# Patient Record
Sex: Female | Born: 1975 | Race: Black or African American | Hispanic: No | Marital: Single | Smoking: Never smoker
Health system: Southern US, Community
[De-identification: ages and names within clinical notes are randomized; demographics above are authoritative.]

## PROBLEM LIST (undated history)

## (undated) DIAGNOSIS — E119 Type 2 diabetes mellitus without complications: Secondary | ICD-10-CM

## (undated) DIAGNOSIS — M1990 Unspecified osteoarthritis, unspecified site: Secondary | ICD-10-CM

## (undated) HISTORY — DX: Type 2 diabetes mellitus without complications: E11.9

## (undated) HISTORY — DX: Unspecified osteoarthritis, unspecified site: M19.90

## (undated) HISTORY — PX: BREAST EXCISIONAL BIOPSY: SUR124

## (undated) HISTORY — PX: BREAST SURGERY: SHX581

## (undated) HISTORY — PX: HERNIA REPAIR: SHX51

## (undated) HISTORY — PX: BREAST BIOPSY: SHX20

---

## 2005-06-14 DIAGNOSIS — K5792 Diverticulitis of intestine, part unspecified, without perforation or abscess without bleeding: Secondary | ICD-10-CM

## 2005-06-14 HISTORY — DX: Diverticulitis of intestine, part unspecified, without perforation or abscess without bleeding: K57.92

## 2017-05-31 ENCOUNTER — Other Ambulatory Visit: Payer: Self-pay | Admitting: Family

## 2017-05-31 DIAGNOSIS — N6320 Unspecified lump in the left breast, unspecified quadrant: Secondary | ICD-10-CM

## 2017-05-31 DIAGNOSIS — N644 Mastodynia: Secondary | ICD-10-CM

## 2017-06-15 ENCOUNTER — Other Ambulatory Visit: Payer: Self-pay

## 2017-06-20 ENCOUNTER — Other Ambulatory Visit: Payer: Self-pay

## 2017-06-30 ENCOUNTER — Other Ambulatory Visit: Payer: Self-pay | Admitting: Family

## 2017-06-30 ENCOUNTER — Other Ambulatory Visit: Payer: Self-pay

## 2017-06-30 DIAGNOSIS — N644 Mastodynia: Secondary | ICD-10-CM

## 2017-07-04 ENCOUNTER — Other Ambulatory Visit: Payer: Self-pay

## 2017-08-29 ENCOUNTER — Other Ambulatory Visit (HOSPITAL_COMMUNITY): Payer: Self-pay | Admitting: *Deleted

## 2017-08-29 DIAGNOSIS — N644 Mastodynia: Secondary | ICD-10-CM

## 2017-09-15 ENCOUNTER — Ambulatory Visit
Admission: RE | Admit: 2017-09-15 | Discharge: 2017-09-15 | Disposition: A | Discharge status: Home / Self Care | Payer: No Typology Code available for payment source | Source: Ambulatory Visit | Attending: Obstetrics and Gynecology | Admitting: Obstetrics and Gynecology

## 2017-09-15 ENCOUNTER — Ambulatory Visit (HOSPITAL_COMMUNITY)
Admission: RE | Admit: 2017-09-15 | Discharge: 2017-09-15 | Disposition: A | Discharge status: Home / Self Care | Payer: Self-pay | Source: Ambulatory Visit | Attending: Obstetrics and Gynecology | Admitting: Obstetrics and Gynecology

## 2017-09-15 ENCOUNTER — Encounter (HOSPITAL_COMMUNITY): Payer: Self-pay

## 2017-09-15 VITALS — BP 114/72 | Ht 65.5 in | Wt 293.8 lb

## 2017-09-15 DIAGNOSIS — N644 Mastodynia: Secondary | ICD-10-CM

## 2017-09-15 DIAGNOSIS — Z1239 Encounter for other screening for malignant neoplasm of breast: Secondary | ICD-10-CM

## 2017-09-15 NOTE — Progress Notes (Signed)
Complaints of bilateral breast lumps x one year left breast and x 2 years right breast. Patient complained of bilateral breast pain that comes and goes. Patient rates the pain at a 10 out of 10.  Pap Smear: Pap smear not completed today. Last Pap smear was at [facility] in December 2017 and normal per patient. Per patient has no history of an abnormal Pap smear. No Pap smear results are in Epic.  Physical exam: Breasts Right breast larger than left breast that per patient is due to a left breast lumpectomy in 2001 for a benign breast lesion. No skin abnormalities bilateral breasts. No nipple retraction bilateral breasts. No nipple discharge bilateral breasts. No lymphadenopathy. No lumps palpated bilateral breasts. Unable to palpated any lumps in patients areas of concern. Complaints of right outer and left inner breast tenderness on exam. [facility] for a diagnostic mamm[facility] and possible bilateral breast ultrasounds. Appointment scheduled for Thursday, September 15, 2017 at 1320.        Pelvic/Bimanual No Pap smear completed today since last Pap smear was in December 2017 per patient. Pap smear not indicated per BCCCP guidelines.   Smoking History: Patient has never smoked.  Patient Navigation: Patient education provided. Access to services provided for patient through BCCCP program.   Breast and Cervical Cancer Risk Assessment: Patient has no family history of breast cancer, known genetic mutations, or radiation treatment to the chest before age 41. Patient has no history of cervical dysplasia, immunocompromised, or DES exposure in-utero.

## 2017-09-15 NOTE — Patient Instructions (Signed)
Explained breast self awareness with Theodosia QuayShuntaneka Driggs. Patient did not need a Pap smear today due to last Pap smear was in December 2017 per patient. Let her know BCCCP will cover Pap smears every 3 years unless has a history of abnormal Pap smears. [facility] for a diagnostic mammogram and possible bilateral breast ultrasounds. Appointment scheduled for Thursday, September 15, 2017 at 1320. Galvin ProfferShuntaneka Halliday verbalized understanding.  Peni Rupard, Kathaleen Maserhristine Poll, RN 2:28 PM

## 2017-09-16 ENCOUNTER — Encounter (HOSPITAL_COMMUNITY): Payer: Self-pay | Admitting: *Deleted

## 2018-02-20 ENCOUNTER — Other Ambulatory Visit (HOSPITAL_COMMUNITY): Payer: Self-pay | Admitting: *Deleted

## 2018-02-20 DIAGNOSIS — Z0000 Encounter for general adult medical examination without abnormal findings: Secondary | ICD-10-CM

## 2018-02-22 ENCOUNTER — Inpatient Hospital Stay: Payer: Self-pay | Attending: Obstetrics and Gynecology | Admitting: *Deleted

## 2018-02-22 ENCOUNTER — Encounter (HOSPITAL_COMMUNITY): Payer: Self-pay

## 2018-02-22 ENCOUNTER — Inpatient Hospital Stay: Payer: Self-pay

## 2018-02-22 VITALS — BP 122/78 | Ht 65.0 in | Wt 298.0 lb

## 2018-02-22 DIAGNOSIS — Z0000 Encounter for general adult medical examination without abnormal findings: Secondary | ICD-10-CM

## 2018-02-22 LAB — LIPID PANEL
Cholesterol: 143 mg/dL (ref 0–200)
HDL: 34 mg/dL — ABNORMAL LOW (ref >40)
LDL Cholesterol: 82 mg/dL (ref 0–99)
Total CHOL/HDL Ratio: 4.2 RATIO
Triglycerides: 134 mg/dL (ref <150)
VLDL: 27 mg/dL (ref 0–40)

## 2018-02-22 LAB — HEMOGLOBIN A1C
HEMOGLOBIN A1C: 7.3 % — AB (ref 4.8–5.6)
MEAN PLASMA GLUCOSE: 162.81 mg/dL

## 2018-02-22 NOTE — Progress Notes (Signed)
[facility] initial screening  Clinical Measurement:  Height: 65in Weight: 298lb Blood Pressure:  120/80  Blood Pressure #2: 122/78  Fasting Labs Drawn Today, will review with patient when they result.  Medical History:  Patient states that she has not been diagnosed with high cholesterol, high blood pressure, diabetes or heart disease.  Medications:  Patients states she is not taking any medications for high cholesterol, high blood pressure or diabetes. Patient states she is taking metformin for prediabetes. She is not taking aspirin daily to prevent heart attack or stroke.    Blood pressure, self measurement:  Patients states she does not measure blood pressure at home.    Nutrition:  Patient states she eats 0 cups of fruit and 2 cups of vegetables in an average day.  Patient states she does  eat fish regularly, she eats more than half a serving of whole grains daily. She drinks less than 36 ounces of beverages with added sugar weekly.  She is currently watching her sodium intake.  She has not had any drinks containing alcohol in the last seven days.    Physical activity:  Patient states that she gets 0 minutes of moderate exercise in a week.  She gets 0 minutes of vigorous exercise per week.   Smoking status:  Patient states she has never smoked and is not around any smokers.     Quality of life:  Patient states that she has had 0 bad physical days out of the last 30 days. In the last 2 weeks, she has had 0 days that she has felt down or depressed. She has had 0 days in the last 2 weeks that she has had little interest or pleasure in doing things.  Risk reduction and counseling:  Patient states she wants to lose weight and increase fruit and vegetable intake.  I encouraged her to continue with current exercise regimen and increase vegetable and fruit intake.  Navigation:  I will notify patient of lab results.  Patient is aware of 2 more health coaching sessions and a follow up.

## 2018-02-22 NOTE — Addendum Note (Signed)
Addended by: Deforest Hoyles D on: 02/22/2018 12:27 PM   Modules accepted: Orders

## 2018-02-22 NOTE — Addendum Note (Signed)
Encounter addended by: Lynnell Dike, LPN on: 6/50/3546 3:05 PM  Actions taken: Vitals modified, Order Reconciliation Section accessed

## 2018-02-27 ENCOUNTER — Telehealth (HOSPITAL_COMMUNITY): Payer: Self-pay | Admitting: *Deleted

## 2018-02-27 NOTE — Telephone Encounter (Signed)
Health Coaching 2   Labs- cholesterol 143, triglycerides 134, LDL cholesterol 82, HDL cholesterol 34, hemoglobin A1C 7.3, mean plasma glucose 162.81    Patient is aware and understands her lab results.  Goals- patient states she wants to lose weight.  I encouraged patient to walk 30 minutes daily times five days and to drink 8 to 10 glasses of water daily.  I also encouraged patient to increase her fruit and vegetable intake to include 5 fruits and vegetables daily.   Navigation: Patient is aware of 1 more health coaching sessions and a follow up. Patient is currently taking Metformin 500 mg daily by mouth although she states she is not diabetic, patient referred to [facility].  Time- 10 minutes

## 2018-03-27 ENCOUNTER — Ambulatory Visit (INDEPENDENT_AMBULATORY_CARE_PROVIDER_SITE_OTHER): Payer: Self-pay | Admitting: Internal Medicine

## 2018-03-27 ENCOUNTER — Other Ambulatory Visit: Payer: Self-pay

## 2018-03-27 ENCOUNTER — Encounter: Payer: Self-pay | Admitting: Internal Medicine

## 2018-03-27 VITALS — BP 130/76 | HR 76 | Temp 99.2°F | Ht 65.0 in | Wt 300.9 lb

## 2018-03-27 DIAGNOSIS — E119 Type 2 diabetes mellitus without complications: Secondary | ICD-10-CM

## 2018-03-27 HISTORY — DX: Type 2 diabetes mellitus without complications: E11.9

## 2018-03-27 MED ORDER — METFORMIN HCL 500 MG PO TABS: 500.0000 mg | ORAL_TABLET | Freq: Two times a day (BID) | ORAL | 3 refills | Status: DC

## 2018-03-27 NOTE — Assessment & Plan Note (Addendum)
Spent large part of visit discussing lifestyle modifications to work towards weight loss and Diabetes management, specifically cutting out processed sugars/carbs, using portion control with meals, and eating more whole fruits and vegetables. Also encouraged her to develop an exercise routine that she can stick to in order to get some form of cardio at least 3-4 days/week.  We will also titrate her Metformin to 500 mg bid for 1 week, with the goal of increasing by 500 mg each week until she gets to 1000 mg bid (if tolerated).  Checking BMET today for baseline creatinine function.  Patient is coming to [place] through [facility] so will hold off on urine microalbumin and eye exam until she is able to get insurance.  Also referring her to Lupita Leash for further nutrition education.

## 2018-03-27 NOTE — Patient Instructions (Addendum)
Sheena Soto, It was a pleasure meeting you!   Today we discussed your Diabetes and how to work on getting blood sugars on better control with diet modification and establishing an exercise routine. You are highly motivated, and I think you will be successful on your weight loss journey.   We will also increase your Metformin to 500 mg twice daily for 1 week. If you tolerate this, we will increase to 1500 mg daily for the next week. If you do well on this, the goal is to get up to 1000 mg twice daily. (see attached sheet for details.)   I will check your kidney function today so we know what your baseline is moving forward to monitor periodically.   Please schedule an appointment to see Lupita Leash, our Diabetes educator and nutritionist, who can help you with specific lifestyle modifications.   We will plan to see you back in 3 months to recheck your A1C.   Take care! Please call before then with any questions or concerns.

## 2018-03-27 NOTE — Progress Notes (Signed)
    CC: Diabetes   HPI:Sheena Soto is a 42 y.o. female who presents today to establish care for recently diagnosed Diabetes at a well adult health screening through [facility]. Patient states she had gestational diabetes 18 years ago. Until health screening last month, her last A1C 3 years ago was in pre-diabetic range. She was placed on Metformin 500 mg daily at this time. Since then, she staets she has been taking it consistently for the most part, but these last 3 months she has not been taking it on a regular basis. Endorses polyuria and polydipsia.   PHQ-9: 6 Based on the patients    Office Visit from 03/27/2018 in [facility]  PHQ-9 Total Score  6     score of 6, she does not need treatment and will continue to monitor periodically.   Past Medical History:  Diagnosis Date  . 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   PMHx: Gestational Diabetes  Surgical Hx: C- section (2002); Umbilical hernia repair (1981)  Allergies: Naproxen   Physical Exam: Vitals:   03/27/18 1338  BP: 130/76  Pulse: 76  Temp: 99.2 F (37.3 C)  TempSrc: Oral  SpO2: 98%  Weight: (!) 300 lb 14.4 oz (136.5 kg)  Height: 5\' 5"  (1.651 m)   General: alert, pleasant female, appears stated age, NAD Cardio: RRR; no murmurs, rubs or gallops Pulmonary: Normal respiratory effort; lungs CTA bilaterally MSK: no lower extremity edema Psych: appropriate mood and affect   Assessment & Plan:   See Encounters Tab for problem based charting.  Patient seen with Dr. Heide Spark

## 2018-03-28 LAB — BMP8+ANION GAP
Anion Gap: 12 mmol/L (ref 10.0–18.0)
BUN/Creatinine Ratio: 10 (ref 9–23)
BUN: 8 mg/dL (ref 6–24)
CALCIUM: 8.9 mg/dL (ref 8.7–10.2)
CHLORIDE: 103 mmol/L (ref 96–106)
CO2: 26 mmol/L (ref 20–29)
Creatinine, Ser: 0.84 mg/dL (ref 0.57–1.00)
GFR calc Af Amer: 100 mL/min/{1.73_m2} (ref >59)
GFR, EST NON AFRICAN AMERICAN: 87 mL/min/{1.73_m2} (ref >59)
GLUCOSE: 88 mg/dL (ref 65–99)
POTASSIUM: 3.8 mmol/L (ref 3.5–5.2)
SODIUM: 141 mmol/L (ref 134–144)

## 2018-03-28 NOTE — Progress Notes (Signed)
Internal Medicine Clinic Attending  I saw and evaluated the patient.  I personally confirmed the key portions of the history and exam documented by Dr. Bloomfield and I reviewed pertinent patient test results.  The assessment, diagnosis, and plan were formulated together and I agree with the documentation in the resident's note.  

## 2018-04-27 ENCOUNTER — Encounter: Payer: Medicaid Other | Admitting: Dietician

## 2018-04-27 ENCOUNTER — Encounter: Payer: Self-pay | Admitting: Internal Medicine

## 2018-04-27 ENCOUNTER — Ambulatory Visit: Payer: Medicaid Other

## 2018-05-02 NOTE — Addendum Note (Signed)
Addended by: Neomia DearPOWERS, Kelyse Pask E on: 05/02/2018 06:15 PM   Modules accepted: Orders

## 2018-05-04 ENCOUNTER — Telehealth (HOSPITAL_COMMUNITY): Payer: Self-pay | Admitting: *Deleted

## 2018-05-04 NOTE — Telephone Encounter (Signed)
Health Coaching 3   Called patient. Patient stated that she had a very recent death in her family.  I did not complete the session, will follow up at a later time. Goals-  New goal-  Barrier to reaching goal-  Strategies to overcome barriers    Navigation:

## 2018-06-30 ENCOUNTER — Emergency Department (HOSPITAL_COMMUNITY): Payer: BLUE CROSS/BLUE SHIELD

## 2018-06-30 ENCOUNTER — Other Ambulatory Visit: Payer: Self-pay

## 2018-06-30 ENCOUNTER — Emergency Department (HOSPITAL_COMMUNITY)
Admission: EM | Admit: 2018-06-30 | Discharge: 2018-07-01 | Disposition: A | Discharge status: Home / Self Care | Payer: BLUE CROSS/BLUE SHIELD | Attending: Emergency Medicine | Admitting: Emergency Medicine

## 2018-06-30 DIAGNOSIS — R002 Palpitations: Secondary | ICD-10-CM | POA: Diagnosis not present

## 2018-06-30 DIAGNOSIS — R079 Chest pain, unspecified: Secondary | ICD-10-CM | POA: Diagnosis not present

## 2018-06-30 DIAGNOSIS — Z7984 Long term (current) use of oral hypoglycemic drugs: Secondary | ICD-10-CM | POA: Diagnosis not present

## 2018-06-30 DIAGNOSIS — R0789 Other chest pain: Secondary | ICD-10-CM | POA: Diagnosis not present

## 2018-06-30 DIAGNOSIS — E119 Type 2 diabetes mellitus without complications: Secondary | ICD-10-CM | POA: Insufficient documentation

## 2018-06-30 DIAGNOSIS — R110 Nausea: Secondary | ICD-10-CM | POA: Diagnosis not present

## 2018-06-30 LAB — BASIC METABOLIC PANEL
ANION GAP: 11 (ref 5–15)
BUN: 10 mg/dL (ref 6–20)
CHLORIDE: 102 mmol/L (ref 98–111)
CO2: 27 mmol/L (ref 22–32)
CREATININE: 1 mg/dL (ref 0.44–1.00)
Calcium: 9.2 mg/dL (ref 8.9–10.3)
Glucose, Bld: 110 mg/dL — ABNORMAL HIGH (ref 70–99)
POTASSIUM: 3.6 mmol/L (ref 3.5–5.1)
SODIUM: 140 mmol/L (ref 135–145)

## 2018-06-30 LAB — CBC
HEMATOCRIT: 40.8 % (ref 36.0–46.0)
HEMOGLOBIN: 13.2 g/dL (ref 12.0–15.0)
MCH: 28.3 pg (ref 26.0–34.0)
MCHC: 32.4 g/dL (ref 30.0–36.0)
MCV: 87.4 fL (ref 80.0–100.0)
Platelets: 421 10*3/uL — ABNORMAL HIGH (ref 150–400)
RBC: 4.67 MIL/uL (ref 3.87–5.11)
RDW: 14.3 % (ref 11.5–15.5)
WBC: 10.7 10*3/uL — AB (ref 4.0–10.5)
nRBC: 0 % (ref 0.0–0.2)

## 2018-06-30 LAB — I-STAT TROPONIN, ED
TROPONIN I, POC: 0.01 ng/mL (ref 0.00–0.08)
TROPONIN I, POC: 0 ng/mL (ref 0.00–0.08)

## 2018-06-30 LAB — TSH: TSH: 4.164 u[IU]/mL (ref 0.350–4.500)

## 2018-06-30 LAB — I-STAT BETA HCG BLOOD, ED (MC, WL, AP ONLY): I-stat hCG, quantitative: <5 m[IU]/mL (ref <5)

## 2018-06-30 MED ORDER — SODIUM CHLORIDE 0.9% FLUSH
3.0000 mL | Freq: Once | INTRAVENOUS | Status: AC
  Administered 2018-06-30: 3 mL via INTRAVENOUS

## 2018-06-30 NOTE — ED Triage Notes (Signed)
Pt here with c/o chest pain and heart racing times 1 week , pt with some slight sob and nausea

## 2018-06-30 NOTE — ED Provider Notes (Signed)
[facility] EMERGENCY DEPARTMENT Provider Note   CSN: [id number] Arrival date & time: 06/30/18  1737     History   Chief Complaint Chief Complaint  Patient presents with  . Chest Pain    HPI Sheena Soto is a 43 y.o. female.  HPI  Sheena Soto is a 43 y.o. female with PMH of pre-diabetes who presents with sensation of intermittent chest tightness and heart racing/skipping beats that lasts on the order of a couple of minutes most days for about the past 5 to 7 days.  She states that she has no history of cardiac abnormality.  No recent falls or trauma.  Has had some nausea throughout the week this week but no emesis.  The tightness is nonradiating and is mostly over her anterior chest.  No numbness or weakness or tingling or paresthesias.  No recent surgery no exogenous hormone use.  Non-smoker.  Negative stress test reported in 2018 in [place], [place], but at that time was told that she had possibly an irregular heartbeat during her stress test from the "top part of her heart" but was told it was "nothing to worry about and normal".  Past Medical History:  Diagnosis Date  . Pre-diabetes     Patient Active Problem List   Diagnosis Date Noted  . Diabetes mellitus type II, non insulin dependent (HCC) 03/27/2018    Past Surgical History:  Procedure Laterality Date  . BREAST SURGERY    . CESAREAN SECTION    . HERNIA REPAIR       OB History    Gravida  3   Para      Term      Preterm      AB  2   Living  1     SAB  1   TAB  1   Ectopic      Multiple      Live Births               Home Medications    Prior to Admission medications   Medication Sig Start Date End Date Taking? Authorizing Provider  calcium carbonate (TUMS - DOSED IN MG ELEMENTAL CALCIUM) 500 MG chewable tablet Chew 1-2 tablets by mouth as needed for indigestion or heartburn.    Yes [provider]  metFORMIN (GLUCOPHAGE) 500 MG tablet  Take 1 tablet (500 mg total) by mouth 2 (two) times daily with a meal. Patient taking differently: Take 500 mg by mouth daily with supper.  03/27/18  Yes Bloomfield, Carley D, DO    Family History Family History  Problem Relation Age of Onset  . Diabetes Mother   . Hypertension Mother   . Diabetes Maternal Grandmother   . Hypertension Maternal Grandmother     Social History Social History   Tobacco Use  . Smoking status: Never Smoker  . Smokeless tobacco: Never Used  Substance Use Topics  . Alcohol use: Yes    Comment: occassionally  . Drug use: Never     Allergies   Naprosyn [naproxen]   Review of Systems Review of Systems  Constitutional: Negative for chills and fever.  HENT: Negative for ear pain and sore throat.   Eyes: Negative for pain and visual disturbance.  Respiratory: Positive for chest tightness. Negative for cough and shortness of breath.   Cardiovascular: Positive for palpitations. Negative for chest pain.  Gastrointestinal: Negative for abdominal pain and vomiting.  Genitourinary: Negative for dysuria and hematuria.  Musculoskeletal: Negative  for arthralgias and back pain.  Skin: Negative for color change and rash.  Neurological: Negative for seizures and syncope.  All other systems reviewed and are negative.    Physical Exam Updated Vital Signs BP (!) 121/55   Pulse 86   Temp 98.2 F (36.8 C) (Oral)   Resp (!) 27   LMP 06/07/2018 (Approximate)   SpO2 100%   Physical Exam Vitals signs and nursing note reviewed.  Constitutional:      General: She is not in acute distress.    Appearance: She is well-developed. She is morbidly obese. She is not ill-appearing.  HENT:     Head: Normocephalic and atraumatic.  Eyes:     Conjunctiva/sclera: Conjunctivae normal.  Neck:     Musculoskeletal: Neck supple.  Cardiovascular:     Rate and Rhythm: Normal rate and regular rhythm.     Heart sounds: S1 normal and S2 normal. No murmur.  Pulmonary:      Effort: Pulmonary effort is normal. No respiratory distress.     Breath sounds: Normal breath sounds.  Abdominal:     Palpations: Abdomen is soft.     Tenderness: There is no abdominal tenderness.  Skin:    General: Skin is warm and dry.  Neurological:     General: No focal deficit present.     Mental Status: She is alert and oriented to person, place, and time.     GCS: GCS eye subscore is 4. GCS verbal subscore is 5. GCS motor subscore is 6.     Cranial Nerves: Cranial nerves are intact.     Sensory: Sensation is intact.  Psychiatric:        Behavior: Behavior is cooperative.      ED Treatments / Results  Labs (all labs ordered are listed, but only abnormal results are displayed) Labs Reviewed  BASIC METABOLIC PANEL - Abnormal; Notable for the following components:      Result Value   Glucose, Bld 110 (*)    All other components within normal limits  CBC - Abnormal; Notable for the following components:   WBC 10.7 (*)    Platelets 421 (*)    All other components within normal limits  TSH  I-STAT TROPONIN, ED  I-STAT BETA HCG BLOOD, ED ([facility], [facility], [facility] ONLY)  I-STAT TROPONIN, ED    EKG None  Radiology Dg Chest 2 View  Result Date: 06/30/2018 CLINICAL DATA:  Chest pain EXAM: CHEST - 2 VIEW COMPARISON:  None. FINDINGS: The heart size and mediastinal contours are within normal limits. Both lungs are clear. The visualized skeletal structures are unremarkable. IMPRESSION: No active cardiopulmonary disease. Electronically Signed   By: Elige KoHetal  Patel   On: 06/30/2018 19:25    Procedures Procedures (including critical care time)  Medications Ordered in ED Medications  sodium chloride flush (NS) 0.9 % injection 3 mL (3 mLs Intravenous Given 06/30/18 2110)     Initial Impression / Assessment and Plan / ED Course  I have reviewed the triage vital signs and the nursing notes.  Pertinent labs & imaging results that were available during my care of the patient were reviewed by me  and considered in my medical decision making (see chart for details).      MDM:  Imaging: Chest x-ray normal.  ED Provider Interpretation of EKG: Sinus rhythm rate of 70 bpm, normal axis, no ST segment elevation or depression, pathologic T wave changes or significant interval irregularity (QTC mildly prolonged at 449).  No delta wave.  No evidence for Brugada syndrome.  Labs: CBC the white count 10.7 otherwise unremarkable, BMP normal, hCG negative, troponin 0.01 and 0.0, TSH 4.16  On initial evaluation, patient appears well. Afebrile and hemodynamically stable. Alert and oriented x4, pleasant, and cooperative.  Presents with chest tightness and sensation of palpitations as detailed above in the HPI.  On exam, there is no focal abnormality. No history of shingles, no rash, and low suspicion for Zoster at this time. No tenderness to palpation and no recent falls or trauma. EKG shows no evidence for acute ischemia or arrhythmia. Troponin undetectable x 2. HEAR 2. Low suspicion for ACS.   Based on EKG and hx I have low suspicion for pericarditis. No evidence for myocarditis. CXR without evidence for pneumonia, pneumothorax, or acute bony abnormality. No fever or leukocytosis and low suspicion for acute infectious etiology. No back pain, pulse discrepancy, or neuro deficit. Doubt aortic dissection.   No history of CHF. BNP not indicated. No orthopnea, PND, and no LE edema. Low suspicion for CHF exacerbation. PERC negative for the course of her stay in the ED until just prior to discharge she had documented tachypnea at 27 breaths/min.  Reevaluated patient prior to discharge and she denied shortness of breath and was not tachypneic at that time.  Very low suspicion for PE.  Spoke to patient and conversation involving shared decision making about further pursuing PE.  Her respirate of 27 is documented 1 time at 10:30 PM was after the patient got up and walked to the restroom.  She had no recurrence.  She  was comfortable with the plan to not pursue d-dimer or CT.  Patient has normal blood counts and electrolytes.  Pregnancy negative.  She has been very stressed recently with the death of multiple family members and her mother who was diagnosed with atrial fibrillation and recently underwent ablation.  She thinks that some of her symptoms may be attributable to stress.  This is possible and she may be experiencing intermittent PVCs or other transient arrhythmia at home given her reported history of possible irregular heartbeat during stress test in 2018.  However, her EKG is normal without evidence for Brugada or other underlying pathology such as WPW.  Intervals are largely normal as above.  Patient may benefit from further outpatient evaluation including Holter monitoring although this is not an option at this time from the ED.  She verbalized understanding and was discharged home in stable condition with PCP follow-up and return precautions.  The plan for this patient was discussed with Dr. Jacqulyn Bath who voiced agreement and who oversaw evaluation and treatment of this patient.   The patient was fully informed and involved with the history taking, evaluation, workup including labs/images, and plan. The patient's concerns and questions were addressed to the patient's satisfaction and she expressed agreement with the plan to DC home.    Final Clinical Impressions(s) / ED Diagnoses   Final diagnoses:  Feeling of chest tightness  Palpitations    ED Discharge Orders    None       Kennadee Walthour, Sherryle Lis, MD 06/30/18 6861    Maia Plan, MD 07/01/18 [phone]

## 2018-07-06 ENCOUNTER — Other Ambulatory Visit: Payer: Self-pay

## 2018-07-06 ENCOUNTER — Ambulatory Visit: Payer: BLUE CROSS/BLUE SHIELD | Admitting: Internal Medicine

## 2018-07-06 ENCOUNTER — Ambulatory Visit: Payer: BLUE CROSS/BLUE SHIELD

## 2018-07-06 VITALS — BP 127/69 | HR 87 | Temp 97.9°F | Ht 65.0 in | Wt 299.8 lb

## 2018-07-06 DIAGNOSIS — K219 Gastro-esophageal reflux disease without esophagitis: Secondary | ICD-10-CM

## 2018-07-06 DIAGNOSIS — F419 Anxiety disorder, unspecified: Secondary | ICD-10-CM

## 2018-07-06 DIAGNOSIS — R002 Palpitations: Secondary | ICD-10-CM | POA: Diagnosis not present

## 2018-07-06 DIAGNOSIS — G4733 Obstructive sleep apnea (adult) (pediatric): Secondary | ICD-10-CM | POA: Diagnosis not present

## 2018-07-06 DIAGNOSIS — E119 Type 2 diabetes mellitus without complications: Secondary | ICD-10-CM

## 2018-07-06 MED ORDER — GLUCOSE BLOOD VI STRP: ORAL_STRIP | 12 refills | Status: DC

## 2018-07-06 MED ORDER — OMEPRAZOLE 20 MG PO CPDR
20.0000 mg | DELAYED_RELEASE_CAPSULE | Freq: Every day | ORAL | 2 refills | Status: DC
Start: 2018-07-06 — End: 2019-09-20

## 2018-07-06 MED ORDER — ONETOUCH DELICA LANCETS 30G MISC: 1.0000 | Freq: Every day | 0 refills | Status: DC

## 2018-07-06 NOTE — Progress Notes (Signed)
   CC: palpitations  HPI:  Sheena Soto is a 43 y.o. with a PMH of T2DM presenting to clinic for palpitations.  Patient endorses about a one-month history of squeezing chest pain associated with palpitations.  First episode occurred while she was having a nightmare and persisted even after waking up.  Subsequent episodes have occurred either during sleep or during rest at daytime; they are never associated with exertion.  Episodes last about 30 seconds to 1 minute and are followed by a short period of fatigue.  Episodes occur every day.  She denies having accompanying symptoms of dizziness, syncope, shortness of breath, radiation of pain, nausea, abdominal pain.  She reports several family members (mainly aunts and uncles) dying of heart issues in their late 62s or 56s.  Patient denies smoking, alcohol, illicit drug use.  Patient reports previously following with cardiology of [facility] for evaluation prior to possible gastric bypass surgery.  She reports having a stress test around 2017 that revealed sinus arrhythmia related to respirations from what she could remember.  She was evaluated for the above symptoms in the emergency department a few days ago.  EKG at the time revealed sinus rhythm without signs of WPW or Brugada.  Chest x-ray was unremarkable.  CBC revealed mild thrombocytosis and slightly elevated white count. Bmet was unremarkable.  TSH was within normal limits.  Urine hCG and delta troponins were negative.  Patient endorses several months of feeling on edge which she associates with multiple deaths in her family.   Patient endorses symptoms of acid reflux after certain meals which she treats with drinking lots of water.  She denies abdominal pain, melena, hematochezia.  Please see problem based Assessment and Plan for status of patients chronic conditions.  Past Medical History:  Diagnosis Date  . Diabetes mellitus type II, non insulin dependent (HCC) 03/27/2018     Review of Systems:   Per HPI.  Physical Exam:  Vitals:   07/06/18 1602  BP: 127/69  Pulse: 87  Temp: 97.9 F (36.6 C)  TempSrc: Oral  SpO2: 98%  Weight: 299 lb 12.8 oz (136 kg)  Height: 5\' 5"  (1.651 m)   GENERAL- alert, co-operative, appears as stated age, not in any distress. CARDIAC- RRR, no murmurs, rubs or gallops. RESP- Moving equal volumes of air, and clear to auscultation bilaterally, no wheezes or crackles. ABDOMEN- Soft, nontender, bowel sounds present. NEURO- CN 2-12 grossly intact. EXTREMITIES- pulse 2+ radial/PT, symmetric, no pedal edema. PSYCH- Normal mood and affect, appropriate thought content and speech.  Assessment & Plan:   See Encounters Tab for problem based charting.   Patient discussed with Dr. Lorin Picket, MD Internal Medicine PGY-3

## 2018-07-06 NOTE — Patient Instructions (Signed)
We'll refer you to cardiology for a heart monitor.

## 2018-07-07 ENCOUNTER — Encounter: Payer: Self-pay | Admitting: Internal Medicine

## 2018-07-07 DIAGNOSIS — R002 Palpitations: Secondary | ICD-10-CM | POA: Insufficient documentation

## 2018-07-07 DIAGNOSIS — K219 Gastro-esophageal reflux disease without esophagitis: Secondary | ICD-10-CM | POA: Insufficient documentation

## 2018-07-07 DIAGNOSIS — F419 Anxiety disorder, unspecified: Secondary | ICD-10-CM | POA: Insufficient documentation

## 2018-07-07 DIAGNOSIS — G4733 Obstructive sleep apnea (adult) (pediatric): Secondary | ICD-10-CM | POA: Insufficient documentation

## 2018-07-07 MED ORDER — GLUCOSE BLOOD VI STRP: ORAL_STRIP | 12 refills | Status: DC

## 2018-07-07 NOTE — Assessment & Plan Note (Signed)
Patient with symptomatic acid reflux.  Based on her description of palpitations and chest pain GERD is not likely contributing to this.  Plan - Start omeprazole 20 mg daily 30 minutes before first meal - Advised on dietary changes

## 2018-07-07 NOTE — Assessment & Plan Note (Signed)
Meter and supplies requested by patient; orders placed. She is non-insulin requiring.

## 2018-07-07 NOTE — Assessment & Plan Note (Addendum)
Patient with history of untreated OSA presenting with 1 month history of palpitations and squeezing chest pain that occur during rest and wake her up at night.  This is concerning for cardiac arrhythmia.  Initial work-up including EKG, CBC, bmet, and TSH were unrevealing for etiology.  Plan - Refer to cardiology for further palpitation work-up - Advised patient start using CPAP

## 2018-07-07 NOTE — Assessment & Plan Note (Signed)
Patient with GAD 7 score of 13 with symptoms lasting for about 4 months.  This may be related to generalized anxiety, however underlying etiology of her palpitations need to be further worked up.  She does endorse lots of stressors in her life related to family and wishes to establish with our integrated behavioral health specialist.  Plan - Referred to integrated behavioral health at [facility] - Continue work-up for palpitations and chest pain as detailed below

## 2018-07-07 NOTE — Assessment & Plan Note (Signed)
Patient reports a history of obstructive sleep apnea but has not been using her CPAP for at least couple of years due to losing a little bit of weight and not experiencing as many apneic episodes.  Plan - Advised patient contact her previous CPAP supplier who is out of town to either get supplies from them or transferred to a local company in [place] so that she can start using her CPAP again

## 2018-07-10 NOTE — Progress Notes (Signed)
Internal Medicine Clinic Attending  Case discussed with Dr. Svalina  at the time of the visit.  We reviewed the resident's history and exam and pertinent patient test results.  I agree with the assessment, diagnosis, and plan of care documented in the resident's note.  

## 2018-07-11 ENCOUNTER — Telehealth: Payer: Self-pay | Admitting: Licensed Clinical Social Worker

## 2018-07-11 NOTE — Telephone Encounter (Signed)
Patient was contacted to follow on a referral. Patient is scheduled for 07/27/2018 at 9:00.

## 2018-07-12 ENCOUNTER — Ambulatory Visit
Admission: RE | Admit: 2018-07-12 | Discharge: 2018-07-12 | Disposition: A | Discharge status: Home / Self Care | Payer: BLUE CROSS/BLUE SHIELD | Source: Ambulatory Visit | Attending: Physician Assistant | Admitting: Physician Assistant

## 2018-07-12 ENCOUNTER — Other Ambulatory Visit: Payer: Self-pay | Admitting: Physician Assistant

## 2018-07-12 DIAGNOSIS — R52 Pain, unspecified: Secondary | ICD-10-CM

## 2018-07-13 ENCOUNTER — Encounter: Payer: Self-pay | Admitting: Interventional Cardiology

## 2018-07-13 ENCOUNTER — Telehealth: Payer: Self-pay | Admitting: *Deleted

## 2018-07-13 ENCOUNTER — Ambulatory Visit (INDEPENDENT_AMBULATORY_CARE_PROVIDER_SITE_OTHER): Payer: BLUE CROSS/BLUE SHIELD | Admitting: Interventional Cardiology

## 2018-07-13 VITALS — BP 120/62 | HR 93 | Ht 65.0 in | Wt 301.0 lb

## 2018-07-13 DIAGNOSIS — R0789 Other chest pain: Secondary | ICD-10-CM

## 2018-07-13 DIAGNOSIS — R002 Palpitations: Secondary | ICD-10-CM

## 2018-07-13 MED ORDER — ACCU-CHEK SAFE-T PRO LANCETS MISC: 12 refills | Status: DC

## 2018-07-13 NOTE — Patient Instructions (Signed)
Medication Instructions:  Your physician recommends that you continue on your current medications as directed. Please refer to the Current Medication list given to you today.  If you need a refill on your cardiac medications before your next appointment, please call your pharmacy.   Lab work: None Ordered  If you have labs (blood work) drawn today and your tests are completely normal, you will receive your results only by: Marland Kitchen MyChart Message (if you have MyChart) OR . A paper copy in the mail If you have any lab test that is abnormal or we need to change your treatment, we will call you to review the results.  Testing/Procedures: Your physician has recommended that you wear an event monitor. Event monitors are medical devices that record the heart's electrical activity. Doctors most often [place] these monitors to diagnose arrhythmias. Arrhythmias are problems with the speed or rhythm of the heartbeat. The monitor is a small, portable device. You can wear one while you do your normal daily activities. This is usually used to diagnose what is causing palpitations/syncope (passing out).   Follow-Up: Based on test results Any Other Special Instructions Will Be Listed Below (If Applicable).   Ambulatory Cardiac Monitoring An ambulatory cardiac monitor is a small recording device that is used to detect abnormal heart rhythms (arrhythmias). Most monitors are connected by wires to flat, sticky disks (electrodes) that are then attached to your chest. You may need to wear a monitor if you have had symptoms such as:  Fast heartbeats (palpitations).  Dizziness.  Fainting or light-headedness.  Unexplained weakness.  Shortness of breath. There are several types of monitors. Some common monitors include:  Holter monitor. This records your heart rhythm continuously, usually for 24-48 hours.  Event (episodic) monitor. This monitor has a symptoms button, and when pushed, it will begin recording. You  need to activate this monitor to record when you have a heart-related symptom.  Automatic detection monitor. This monitor will begin recording when it detects an abnormal heartbeat. What are the risks? Generally, these devices are safe to use. However, it is possible that the skin under the electrodes will become irritated. How to prepare for monitoring Your health care provider will prepare your chest for the electrode placement and show you how to use the monitor.  Do not apply lotions to your chest before monitoring.  Follow directions on how to care for the monitor, and how to return the monitor when the testing period is complete. How to use your cardiac monitor  Follow directions about how long to wear the monitor, and if you can take the monitor off in order to shower or bathe. ? Do not let the monitor get wet. ? Do not bathe, swim, or use a hot tub while wearing the monitor.  Keep your skin clean. Do not put body lotion or moisturizer on your chest.  Change the electrodes as told by your health care provider, or any time they stop sticking to your skin. You may need to use medical tape to keep them on.  Try to put the electrodes in slightly different places on your chest to help prevent skin irritation. Follow directions from your health care provider about where to place the electrodes.  Make sure the monitor is safely clipped to your clothing or in a location close to your body as recommended by your health care provider.  If your monitor has a symptoms button, press the button to mark an event as soon as you feel a  heart-related symptom, such as: ? Dizziness. ? Weakness. ? Light-headedness. ? Palpitations. ? Thumping or pounding in your chest. ? Shortness of breath. ? Unexplained weakness.  Keep a diary of your activities, such as walking, doing chores, and taking medicine. It is very important to note what you were doing when you pushed the button to record your  symptoms. This will help your health care provider determine what might be contributing to your symptoms.  Send the recorded information as recommended by your health care provider. It may take some time for your health care provider to process the results.  Change the batteries as told by your health care provider.  Keep electronic devices away from your monitor. These include: ? Tablets. ? MP3 players. ? Cell phones.  While wearing your monitor you should avoid: ? Electric blankets. ? Firefighter. ? Electric toothbrushes. ? Microwave ovens. ? Magnets. ? Metal detectors. Get help right away if:  You have chest pain.  You have shortness of breath or extreme difficulty breathing.  You develop a very fast heartbeat that does not get better.  You develop dizziness that does not go away.  You faint or constantly feel like you are about to faint. Summary  An ambulatory cardiac monitor is a small recording device that is used to detect abnormal heart rhythms (arrhythmias).  Make sure you understand how to send the information from the monitor to your health care provider.  It is important to press the button on the monitor when you have any heart-related symptoms.  Keep a diary of your activities, such as walking, doing chores, and taking medicine. It is very important to note what you were doing when you pushed the button to record your symptoms. This will help your health care provider learn what might be causing your symptoms. This information is not intended to replace advice given to you by your health care provider. Make sure you discuss any questions you have with your health care provider. Document Released: 03/09/2008 Document Revised: 03/16/2017 Document Reviewed: 05/15/2016 Elsevier Interactive Patient Education  2019 [employer].

## 2018-07-13 NOTE — Telephone Encounter (Signed)
Fax from [employer] - One Touch Ultra test strips / lancets are not covered by pt's insurance - please change to Accu-chek products which are covered. Thanks

## 2018-07-13 NOTE — Progress Notes (Signed)
Cardiology Office Note   Date:  07/13/2018   ID:  Sheena Soto, DOB 1976/03/15, MRN [id number]  PCP:  Bridget Hartshorn, DO    No chief complaint on file.  palpitations  Wt Readings from Last 3 Encounters:  07/13/18 (!) 301 lb (136.5 kg)  07/06/18 299 lb 12.8 oz (136 kg)  03/27/18 (!) 300 lb 14.4 oz (136.5 kg)       History of Present Illness: Sheena Soto is a 43 y.o. female who is being seen today for the evaluation of palpitations at the request of Sheena Soto,*.  She has had palpitations that have woke her from sleep, and then occur at rest.  She had some chest pain at times as well, associated with the palpitations. THe episodes have decreased in frequency over the past week.  No episdeos in the that time.  Sx worse with emotional stress.  This past week was relaxed and her sx did not occur.  Stress test in the past were normal.  She was told there was some "arrhythmia."  Walking is most strenuous exercise.  No palpitations with walking.  No chest pain with activity.    She feels stressed when she wakes up and has the sx.   She wore a monitor in the past.     Past Medical History:  Diagnosis Date  . Diabetes mellitus type II, non insulin dependent (HCC) 03/27/2018    Past Surgical History:  Procedure Laterality Date  . BREAST SURGERY    . CESAREAN SECTION    . HERNIA REPAIR       Current Outpatient Medications  Medication Sig Dispense Refill  . glucose blood (ONE TOUCH ULTRA TEST) test strip Use as instructed 100 each 12  . metFORMIN (GLUCOPHAGE) 500 MG tablet Take 1 tablet (500 mg total) by mouth 2 (two) times daily with a meal. (Patient taking differently: Take 500 mg by mouth daily with supper. ) 60 tablet 3  . omeprazole (PRILOSEC) 20 MG capsule Take 1 capsule (20 mg total) by mouth daily. Take 30 minutes before your first meal of the day. 30 capsule 2  . Lancets (ACCU-CHEK SAFE-T PRO) lancets Use as instructed 100 each 12    No current facility-administered medications for this visit.     Allergies:   Naprosyn [naproxen]    Social History:  The patient  reports that she has never smoked. She has never used smokeless tobacco. She reports current alcohol use. She reports that she does not use drugs.   Family History:  The patient's family history includes Atrial fibrillation in her mother; Diabetes in her maternal grandmother and mother; Heart attack (age of onset: 8) in her maternal uncle; Hypertension in her maternal grandmother and mother.    ROS:  Please see the history of present illness.   Otherwise, review of systems are positive for palpitations, stress.   All other systems are reviewed and negative.    PHYSICAL EXAM: VS:  BP 120/62   Pulse 93   Ht 5\' 5"  (1.651 m)   Wt (!) 301 lb (136.5 kg)   SpO2 98%   BMI 50.09 kg/m  , BMI Body mass index is 50.09 kg/m. GEN: Well nourished, well developed, in no acute distress  HEENT: normal  Neck: no JVD, carotid bruits, or masses Cardiac: RRR; no murmurs, rubs, or gallops,no edema  Respiratory:  clear to auscultation bilaterally, normal work of breathing GI: soft, nontender, nondistended, + BS, obese MS: no deformity or atrophy  Skin: warm and dry, no rash Neuro:  Strength and sensation are intact Psych: euthymic mood, full affect   EKG:   The ekg ordered today demonstrates NSR, no ST changes   Recent Labs: 06/30/2018: BUN 10; Creatinine, Ser 1.00; Hemoglobin 13.2; Platelets 421; Potassium 3.6; Sodium 140; TSH 4.164   Lipid Panel    Component Value Date/Time   CHOL 143 02/22/2018 1227   TRIG 134 02/22/2018 1227   HDL 34 (L) 02/22/2018 1227   CHOLHDL 4.2 02/22/2018 1227   VLDL 27 02/22/2018 1227   LDLCALC 82 02/22/2018 1227     Other studies Reviewed: Additional studies/ records that were reviewed today with results demonstrating: labs reviewed. Normal electrolytes and TSH.   ASSESSMENT AND PLAN:  1. Palpitations: Plan for 30-day  monitor.  Symptoms sound like they are more related to emotional stress.  As this has decreased, her symptoms have become less frequent. 2. Chest discomfort: She has had a prior ischemic evaluation which was negative.  Symptoms have also resolved more recently as stress level has decreased. 3. She is considering weight loss surgery for morbid obesity.  She is unsure if she will need another cardiac evaluation prior to that time.  Will base ischemic evaluation on her prior testing and any symptoms she may be having at that time.  Nothing is set at this point.  She does know that she will want to have her [place] in [place].   Current medicines are reviewed at length with the patient today.  The patient concerns regarding her medicines were addressed.  The following changes have been made:  No change  Labs/ tests ordered today include: monitor No orders of the defined types were placed in this encounter.   Recommend 150 minutes/week of aerobic exercise Low fat, low carb, high fiber diet recommended  Disposition:   FU based on test result   Signed, Lance MussJayadeep Barrie Sigmund, MD  07/13/2018 3:03 PM    [facility] [address], [place], [place]  [address] Phone: [phone]; Fax: [phone]

## 2018-07-13 NOTE — Telephone Encounter (Signed)
I have switched to Accu Chek.  Thanks!

## 2018-07-19 ENCOUNTER — Ambulatory Visit: Payer: BLUE CROSS/BLUE SHIELD | Admitting: Licensed Clinical Social Worker

## 2018-07-19 ENCOUNTER — Encounter: Payer: Self-pay | Admitting: Internal Medicine

## 2018-07-24 ENCOUNTER — Telehealth: Payer: Self-pay | Admitting: Dietician

## 2018-07-24 NOTE — Telephone Encounter (Signed)
Missed appointment rescheduled for 08/02/18

## 2018-07-27 ENCOUNTER — Ambulatory Visit (INDEPENDENT_AMBULATORY_CARE_PROVIDER_SITE_OTHER): Payer: BLUE CROSS/BLUE SHIELD

## 2018-07-27 DIAGNOSIS — R002 Palpitations: Secondary | ICD-10-CM

## 2018-08-02 ENCOUNTER — Ambulatory Visit (INDEPENDENT_AMBULATORY_CARE_PROVIDER_SITE_OTHER): Payer: BLUE CROSS/BLUE SHIELD | Admitting: Dietician

## 2018-08-02 ENCOUNTER — Other Ambulatory Visit: Payer: Self-pay | Admitting: Dietician

## 2018-08-02 ENCOUNTER — Encounter: Payer: Self-pay | Admitting: Dietician

## 2018-08-02 DIAGNOSIS — Z6841 Body Mass Index (BMI) 40.0 and over, adult: Secondary | ICD-10-CM | POA: Diagnosis not present

## 2018-08-02 DIAGNOSIS — Z713 Dietary counseling and surveillance: Secondary | ICD-10-CM

## 2018-08-02 DIAGNOSIS — E119 Type 2 diabetes mellitus without complications: Secondary | ICD-10-CM

## 2018-08-02 NOTE — Patient Instructions (Signed)
Homework-   1- how many calories in your homemade sweet vs sheetz or [employer]onalds tea 2- how much cold tea do you drink a day- how many calories How much hot tea calories per day. And a list of the pros and cons of each might helpful 3- how many calories do you consume each day.   Think about ways you can move more in your daily life.   PLease write on blank sheet of paper and bring with you to your next visit.   Suggest follow up in 1-2 weeks. My earliest appointment is 815 am and my last is 315 pm.   Lupita Leash [phone]

## 2018-08-02 NOTE — Progress Notes (Signed)
Referral request

## 2018-08-02 NOTE — Progress Notes (Signed)
  Medical Nutrition Therapy:  Appt start time: 0935 end time:  1015. Visit # 1  Assessment:  Primary concerns today: wants to begin weight management program prior to bariatric surgery. Multiple previous weight loss attempts; one of them using My[employer]itnessPal, losing 47 lbs at one point. Reads labels for calories, serving size, sodium, sugar. Reports knee pain as primary motivator for weight loss. Accompanied by her daughter today with whom she resides.   Preferred Learning Style:   No preference indicated   Learning Readiness:   Ready  ANTHROPOMETRICS: Estimated body mass index is 50.16 kg/m as calculated from the following:   Height as of 07/13/18: 5\' 5"  (1.651 m).   Weight as of this encounter: 301 lb 6.4 oz (136.7 kg).  WEIGHT HISTORY: Highest:  Wt Readings from Last 5 Encounters:  08/02/18 (!) 301 lb 6.4 oz (136.7 kg)  07/13/18 (!) 301 lb (136.5 kg)  07/06/18 299 lb 12.8 oz (136 kg)  03/27/18 (!) 300 lb 14.4 oz (136.5 kg)  02/22/18 298 lb (135.2 kg)   SLEEP: need to assess at follow-up visit  MEDICATIONS:  Current Outpatient Medications:  .  glucose blood (ONE TOUCH ULTRA TEST) test strip, Use as instructed, Disp: 100 each, Rfl: 12 .  Lancets (ACCU-CHEK SAFE-T PRO) lancets, Use as instructed, Disp: 100 each, Rfl: 12 .  metFORMIN (GLUCOPHAGE) 500 MG tablet, Take 1 tablet (500 mg total) by mouth 2 (two) times daily with a meal. (Patient taking differently: Take 500 mg by mouth daily with supper. ), Disp: 60 tablet, Rfl: 3 .  omeprazole (PRILOSEC) 20 MG capsule, Take 1 capsule (20 mg total) by mouth daily. Take 30 minutes before your first meal of the day., Disp: 30 capsule, Rfl: 2  BLOOD SUGAR:  Lab Results  Component Value Date   HGBA1C 7.3 (H) 02/22/2018     DIETARY INTAKE: Usual eating pattern includes 1 meals and ? snacks per day. Dining out: frequent [employer] for sweet tea  Frequent sweet tea intake, will drink multiple 16oz portions while at home, or drink  large 32 oz sizes from [employer]/other fast food restaurants. Will also drink hot tea with >1Tbsp honey, unsure how many per day.   Usual physical activity: Currently sedentary, works from home   Estimated energy needs for wt maintenance: 2300-2500 calories  Progress Towards Goal(s):  In progress.   Nutritional Diagnosis:  NI-5.8.3 Inappropriate intake of types of carbohydrates (specify): Sugar from sweetened beverages As related to Drinking large portions of sugar sweetened beverages at restaurants and at home.  As evidenced by Patient's report.    Intervention:  Nutrition education and counseling.  Action Goal: Monitor calories from sugar sweetened beverages, measure and record amount consumed   Outcome goal: Increased knowledge of dietary intakes Coordination of care: consider reaching out to bariatric surgeon  Teaching Method Utilized: Visual, Auditory,Hands on Handouts given during visit include:  After visit summary Barriers to learning/adherence to lifestyle change: Competing values Demonstrated degree of understanding via:  Teach Back   Monitoring/Evaluation:  Dietary intake, exercise, and body weight in 2 week(s) (Recommended).   Norm Parcel, RD 08/02/2018 12:20 PM.

## 2018-08-07 DIAGNOSIS — R7303 Prediabetes: Secondary | ICD-10-CM | POA: Diagnosis not present

## 2018-08-07 DIAGNOSIS — Z6841 Body Mass Index (BMI) 40.0 and over, adult: Secondary | ICD-10-CM | POA: Diagnosis not present

## 2018-08-08 ENCOUNTER — Other Ambulatory Visit: Payer: Self-pay

## 2018-08-08 ENCOUNTER — Other Ambulatory Visit: Payer: Self-pay | Admitting: Obstetrics and Gynecology

## 2018-08-09 ENCOUNTER — Telehealth: Payer: Self-pay | Admitting: Interventional Cardiology

## 2018-08-09 DIAGNOSIS — I472 Ventricular tachycardia, unspecified: Secondary | ICD-10-CM

## 2018-08-09 DIAGNOSIS — R9431 Abnormal electrocardiogram [ECG] [EKG]: Secondary | ICD-10-CM

## 2018-08-09 NOTE — Telephone Encounter (Signed)
Called patient back. Patient stated at the time of event she was walking to her car drinking coffee at a fast paced. Patient had to walk about a block to her car. Patient stated she did not have any symptoms other than a brain freeze from the ice coffee. Will review monitor with DOD, Dr. Graciela Husbands.  Dr. Graciela Husbands agreed to monitor. Last EKG without significant abnormality. Dr. Graciela Husbands recommend patient to have an echo for risk stratification. Dr. Graciela Husbands will be glad to see patient if Dr. Eldridge Dace wants to refer to EP.   Called patient with recommendation. Placed order for echo. Patient is aware someone will call her to schedule echo.  Will forward to Dr. Eldridge Dace and his nurse for further advisement.

## 2018-08-09 NOTE — Telephone Encounter (Signed)
[employer] calling about a critical reading from patient's monitor, auto triggered at 11:11 am on 08/09/18 - V tach 13 beats HR 173, back to sinus rhythm.   Left message for patient to call back.

## 2018-08-09 NOTE — Telephone Encounter (Signed)
I agree with echo.

## 2018-08-09 NOTE — Telephone Encounter (Signed)
Nurse from [employer] called wanting to speak to triage.

## 2018-08-15 ENCOUNTER — Telehealth: Payer: Self-pay | Admitting: Interventional Cardiology

## 2018-08-15 NOTE — Telephone Encounter (Signed)
New Message   Patient states she's finished with Holter monitor and needs to know where to send it.

## 2018-08-16 ENCOUNTER — Ambulatory Visit (HOSPITAL_COMMUNITY): Payer: BLUE CROSS/BLUE SHIELD | Attending: Cardiology

## 2018-08-16 DIAGNOSIS — R9431 Abnormal electrocardiogram [ECG] [EKG]: Secondary | ICD-10-CM

## 2018-08-16 DIAGNOSIS — I472 Ventricular tachycardia, unspecified: Secondary | ICD-10-CM

## 2018-08-17 DIAGNOSIS — R0602 Shortness of breath: Secondary | ICD-10-CM | POA: Diagnosis not present

## 2018-08-17 DIAGNOSIS — K2950 Unspecified chronic gastritis without bleeding: Secondary | ICD-10-CM | POA: Diagnosis not present

## 2018-08-17 DIAGNOSIS — K2960 Other gastritis without bleeding: Secondary | ICD-10-CM | POA: Diagnosis not present

## 2018-08-17 DIAGNOSIS — Z7984 Long term (current) use of oral hypoglycemic drugs: Secondary | ICD-10-CM | POA: Diagnosis not present

## 2018-08-17 DIAGNOSIS — G4730 Sleep apnea, unspecified: Secondary | ICD-10-CM | POA: Diagnosis not present

## 2018-08-17 DIAGNOSIS — E119 Type 2 diabetes mellitus without complications: Secondary | ICD-10-CM | POA: Diagnosis not present

## 2018-08-17 DIAGNOSIS — Z6841 Body Mass Index (BMI) 40.0 and over, adult: Secondary | ICD-10-CM | POA: Diagnosis not present

## 2018-08-17 DIAGNOSIS — Z01818 Encounter for other preprocedural examination: Secondary | ICD-10-CM | POA: Diagnosis not present

## 2018-09-13 ENCOUNTER — Other Ambulatory Visit: Payer: Self-pay

## 2018-09-13 ENCOUNTER — Telehealth (INDEPENDENT_AMBULATORY_CARE_PROVIDER_SITE_OTHER): Payer: BLUE CROSS/BLUE SHIELD | Admitting: Internal Medicine

## 2018-09-13 DIAGNOSIS — I472 Ventricular tachycardia, unspecified: Secondary | ICD-10-CM

## 2018-09-13 DIAGNOSIS — G4733 Obstructive sleep apnea (adult) (pediatric): Secondary | ICD-10-CM | POA: Diagnosis not present

## 2018-09-13 DIAGNOSIS — R002 Palpitations: Secondary | ICD-10-CM

## 2018-09-13 DIAGNOSIS — R9431 Abnormal electrocardiogram [ECG] [EKG]: Secondary | ICD-10-CM

## 2018-09-13 NOTE — Progress Notes (Signed)
Electrophysiology TeleHealth Note   Due to national recommendations of social distancing due to COVID 19, an audio/video telehealth visit is felt to be most appropriate for this patient at this time.  See MyChart message from today for the patient's consent to telehealth for [facility]   Date:  09/13/2018   ID:  Sheena Soto, DOB 08/07/1975, MRN [id number]  Location: patient's home  Provider location: [address] [place]  Evaluation Performed: Initial Evaluation  PCP:  Bridget Hartshorn, DO  Cardiologist: JV Electrophysiologist:  None   Chief Complaint:  palpitations  History of Present Illness:    Sheena Soto is a 43 y.o. female who presents via audio/video conferencing for a telehealth visit today for  Abnormal monitor undertaken for palpitations       3/20 Echo Nl LV function  LVE mild 3/20 Tele >> VT NS  Palpitations -- PACs  Chest pressure sinus and PVCs   Personally reviewed   1/20  NSR with short PR and normal intervals   Called for VT and had been running   Family hx notable for brother(half-maternal) dying in a single car accident fall 2019-- no autopsy; maternal aunt died of "heart attack" during her sleep  Morbidly obese with known and untreated sleep apnea; anticpating weight loss reduction surgery  Not fit--always thick    The patient denies symptoms of fevers, chills, cough, or new SOB worrisome for COVID 19.    Past Medical History:  Diagnosis Date  . Diabetes mellitus type II, non insulin dependent (HCC) 03/27/2018    Past Surgical History:  Procedure Laterality Date  . BREAST SURGERY    . CESAREAN SECTION    . HERNIA REPAIR      Current Outpatient Medications  Medication Sig Dispense Refill  . glucose blood (ONE TOUCH ULTRA TEST) test strip Use as instructed 100 each 12  . Lancets (ACCU-CHEK SAFE-T PRO) lancets Use as instructed 100 each 12  . metFORMIN (GLUCOPHAGE) 500 MG tablet Take 1 tablet (500 mg  total) by mouth 2 (two) times daily with a meal. (Patient taking differently: Take 500 mg by mouth daily with supper. ) 60 tablet 3  . omeprazole (PRILOSEC) 20 MG capsule Take 1 capsule (20 mg total) by mouth daily. Take 30 minutes before your first meal of the day. 30 capsule 2   No current facility-administered medications for this visit.     Allergies:   Naprosyn [naproxen]   Social History:  The patient  reports that she has never smoked. She has never used smokeless tobacco. She reports current alcohol use. She reports that she does not use drugs.   Family History:  The patient's   family history includes Atrial fibrillation in her mother; Diabetes in her maternal grandmother and mother; Heart attack (age of onset: 78) in her maternal uncle; Hypertension in her maternal grandmother and mother.   ROS:  Please see the history of present illness.   All other systems are personally reviewed and negative.    Exam:    Vital Signs:  There were no vitals taken for this visit.    Well appearing, alert and conversant, regular work of breathing,  good skin color Eyes- anicteric, neuro- grossly intact, skin- no apparent rash or lesions or cyanosis, mouth- oral mucosa is pink   Labs/Other Tests and Data Reviewed:    Recent Labs: 06/30/2018: BUN 10; Creatinine, Ser 1.00; Hemoglobin 13.2; Platelets 421; Potassium 3.6; Sodium 140; TSH 4.164   Wt  Readings from Last 3 Encounters:  08/02/18 (!) 301 lb 6.4 oz (136.7 kg)  07/13/18 (!) 301 lb (136.5 kg)  07/06/18 299 lb 12.8 oz (136 kg)     Other studies personally reviewed: Additional studies/ records that were reviewed today include: As above    *  . The tracings reveal As above       ASSESSMENT & PLAN:    VT Nonsustained  FHx of ? Sudden Death ( single vehicle MVA)  Morbidly obese  Sleep Apnea -untreated  Pt has VT NS in setting of ECG and normal LV systolic function with mild LVE.  The concern for possible underlying  cardiomyopathy is heightened by the story of her brother's death.  Unfortunately he was not subjected to autopsy  We will work of clarifying family history  We will use SAECG and cMRI to look for evidence of subclinical cardiomyopathy  Have reviewed with the patient         COVID 19 screen The patient denies symptoms of COVID 19 at this time.  The importance of social distancing was discussed today.  Follow-up:  55m Next remote:   Current medicines are reviewed at length with the patient today.   The patient does not have concerns regarding her medicines.  The following changes were made today:  none  Labs/ tests ordered today include:   No orders of the defined types were placed in this encounter.   Future tests ( post COVID )  cMRI and SAECG in 3  months  Patient Risk:  after full review of this patients clinical status, I feel that they are at moderate risk at this time.  Today, I have spent 42 minutes with the patient with telehealth technology discussing As above  .    Signed, Sherryl Manges, MD  09/13/2018 4:26 PM     [facility] [address] [place] [address] [phone] (office) [phone] (fax)

## 2018-12-07 ENCOUNTER — Encounter: Payer: Self-pay | Admitting: *Deleted

## 2018-12-18 ENCOUNTER — Encounter: Payer: BLUE CROSS/BLUE SHIELD | Admitting: Internal Medicine

## 2018-12-22 ENCOUNTER — Ambulatory Visit (HOSPITAL_COMMUNITY)
Admission: RE | Admit: 2018-12-22 | Discharge: 2018-12-22 | Disposition: A | Discharge status: Home / Self Care | Payer: BC Managed Care – PPO | Source: Ambulatory Visit | Attending: Internal Medicine | Admitting: Internal Medicine

## 2018-12-22 ENCOUNTER — Other Ambulatory Visit: Payer: Self-pay

## 2018-12-22 ENCOUNTER — Telehealth: Payer: Self-pay | Admitting: Internal Medicine

## 2018-12-22 DIAGNOSIS — I472 Ventricular tachycardia, unspecified: Secondary | ICD-10-CM

## 2018-12-22 LAB — CREATININE, SERUM
Creatinine, Ser: 0.89 mg/dL (ref 0.44–1.00)
GFR calc Af Amer: >60 mL/min (ref >60)
GFR calc non Af Amer: >60 mL/min (ref >60)

## 2018-12-22 MED ORDER — GADOBUTROL 1 MMOL/ML IV SOLN
12.0000 mL | Freq: Once | INTRAVENOUS | Status: AC | PRN
  Administered 2018-12-22: 10:00:00 12 mL via INTRAVENOUS

## 2018-12-22 NOTE — Telephone Encounter (Signed)
New Message ° ° ° °Left message to confirm appt and answer covid questions  °

## 2018-12-25 ENCOUNTER — Encounter: Payer: Self-pay | Admitting: Internal Medicine

## 2018-12-25 ENCOUNTER — Ambulatory Visit (INDEPENDENT_AMBULATORY_CARE_PROVIDER_SITE_OTHER): Payer: BC Managed Care – PPO | Admitting: Internal Medicine

## 2018-12-25 ENCOUNTER — Other Ambulatory Visit: Payer: Self-pay

## 2018-12-25 VITALS — BP 124/80 | HR 88 | Ht 65.0 in | Wt 297.4 lb

## 2018-12-25 DIAGNOSIS — R002 Palpitations: Secondary | ICD-10-CM | POA: Diagnosis not present

## 2018-12-25 DIAGNOSIS — I472 Ventricular tachycardia, unspecified: Secondary | ICD-10-CM | POA: Insufficient documentation
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
## 2018-12-25 NOTE — Patient Instructions (Signed)

## 2018-12-26 ENCOUNTER — Other Ambulatory Visit: Payer: Self-pay | Admitting: Internal Medicine

## 2019-01-01 ENCOUNTER — Other Ambulatory Visit: Payer: Self-pay

## 2019-01-01 ENCOUNTER — Ambulatory Visit (INDEPENDENT_AMBULATORY_CARE_PROVIDER_SITE_OTHER): Payer: BC Managed Care – PPO | Admitting: Internal Medicine

## 2019-01-01 ENCOUNTER — Encounter: Payer: Self-pay | Admitting: Internal Medicine

## 2019-01-01 VITALS — BP 122/98 | HR 105 | Temp 98.4°F | Ht 65.0 in | Wt 299.5 lb

## 2019-01-01 DIAGNOSIS — Z23 Encounter for immunization: Secondary | ICD-10-CM | POA: Diagnosis not present

## 2019-01-01 DIAGNOSIS — E119 Type 2 diabetes mellitus without complications: Secondary | ICD-10-CM | POA: Diagnosis not present

## 2019-01-01 DIAGNOSIS — M170 Bilateral primary osteoarthritis of knee: Secondary | ICD-10-CM | POA: Insufficient documentation

## 2019-01-01 DIAGNOSIS — G8929 Other chronic pain: Secondary | ICD-10-CM

## 2019-01-01 DIAGNOSIS — L739 Follicular disorder, unspecified: Secondary | ICD-10-CM | POA: Diagnosis not present

## 2019-01-01 DIAGNOSIS — Z124 Encounter for screening for malignant neoplasm of cervix: Secondary | ICD-10-CM

## 2019-01-01 DIAGNOSIS — Z7984 Long term (current) use of oral hypoglycemic drugs: Secondary | ICD-10-CM

## 2019-01-01 DIAGNOSIS — Z0000 Encounter for general adult medical examination without abnormal findings: Secondary | ICD-10-CM | POA: Insufficient documentation

## 2019-01-01 LAB — POCT GLYCOSYLATED HEMOGLOBIN (HGB A1C): Hemoglobin A1C: 7.7 % — AB (ref 4.0–5.6)

## 2019-01-01 LAB — GLUCOSE, CAPILLARY: Glucose-Capillary: 266 mg/dL — ABNORMAL HIGH (ref 70–99)

## 2019-01-01 MED ORDER — METFORMIN HCL 1000 MG PO TABS: 1000.0000 mg | ORAL_TABLET | Freq: Two times a day (BID) | ORAL | 11 refills | Status: DC

## 2019-01-01 NOTE — Progress Notes (Signed)
   CC: DM, sore in axilla   HPI:  [name] is a 43 y.o. female with PMHx listed below who presents for follow-up on type II DM and sore underneath axilla for the last 3 weeks.   Past Medical History:  Diagnosis Date  . Diabetes mellitus type II, non insulin dependent ([place]) 03/27/2018   Review of Systems:  Review of Systems  Constitutional: Negative for chills, fever and weight loss.  HENT: Negative for congestion and sore throat.   Eyes: Negative for blurred vision.  Respiratory: Negative for cough and shortness of breath.   Cardiovascular: Negative for chest pain and palpitations.  Gastrointestinal: Negative for abdominal pain and constipation.  Genitourinary: Negative for dysuria.  Musculoskeletal: Positive for joint pain. Negative for falls.  Neurological: Negative for sensory change and focal weakness.  Endo/Heme/Allergies: Negative for polydipsia.    Physical Exam:  Vitals:   01/01/19 1408  BP: (!) 122/98  Pulse: (!) 105  Temp: 98.4 F (36.9 C)  TempSrc: Oral  SpO2: 100%  Weight: 299 lb 8 oz (135.9 kg)  Height: 5\' 5"  (1.651 m)   Physical Exam Constitutional:      Appearance: She is obese.  Eyes:     Conjunctiva/sclera: Conjunctivae normal.  Neck:     Musculoskeletal: Neck supple.  Cardiovascular:     Rate and Rhythm: Normal rate and regular rhythm.     Pulses: Normal pulses.  Pulmonary:     Effort: Pulmonary effort is normal.     Breath sounds: Normal breath sounds.  Abdominal:     General: There is no distension.     Palpations: Abdomen is soft.     Tenderness: There is no abdominal tenderness.  Musculoskeletal:     Comments: Knees with crepitus. No joint line tenderness.   Lymphadenopathy:     Cervical: No cervical adenopathy.  Skin:    Comments: Left axilla with 0.5 cm of ulceration with mild bleeding. No purulent drainage, increased warmth or tenderness.   Neurological:     General: No focal deficit present.     Mental Status: She is  alert and oriented to person, place, and time.  Psychiatric:        Mood and Affect: Mood normal.        Behavior: Behavior normal.     Assessment & Plan:   See Encounters Tab for problem based charting.  Patient discussed with Dr. Rebeca Alert

## 2019-01-01 NOTE — Assessment & Plan Note (Signed)
Patient was under the impression she was pre-diabetic so spent a significant amount of time educating and explaining her diagnosis of Diabetes. She had been taking Metformin 500 mg BID, but not on a consistent basis. Discussed plan to titrate up to 1000 mg BID. Also encouraged combination of diet and exercise to work on weight loss which will also help lower A1C.  - check urine microalbumin today - diabetic foot exam done today  - f/u in 3 months

## 2019-01-01 NOTE — Assessment & Plan Note (Signed)
Patient endorses recurrent sores of bilateral axilla that normally resolve within a few days. Most recent one has lasted 3 weeks. No signs of infection on exam. Advised her to continue keeping area clean and dry and use OTC antibacterial ointment.  If she continues to have sores that are unresolving, may need to pursue work-up for hidradenitis suppurativa.

## 2019-01-01 NOTE — Assessment & Plan Note (Signed)
Patient reports longstanding history of chronic bilateral knee pain which is likely due to primary OA given her BMI. Discussed weight loss will also significantly help with her knee pain. She is also interested in physical therapy and requests ortho referral.

## 2019-01-01 NOTE — Assessment & Plan Note (Signed)
Patient would like to be referred to GYN for cervical cancer screening. Referral placed today.

## 2019-01-01 NOTE — Patient Instructions (Signed)
Ms. Rubendall, It was a pleasure seeing you today!   Today we discussed: Diabetes - based on your A1C, we have made the diagnosis of diabetes. We talked about taking metformin 1000 mg twice daily. I commend you for working on diet and exercise, as weight loss will also significantly help reduce your A1C.   For the sore under your arm - continue keeping it cleaned and covered like you are. You can also apply Mupirocin or Neosporin daily to prevent infection. It does not look infected today to warrant an antibiotic.   I have placed the referral for physical therapy and to an orthopedic doctor for your chronic knee pain.   I'll plan to see you back in 3 months!  Take care, Dr. Koleen Distance

## 2019-01-01 NOTE — Assessment & Plan Note (Signed)
Tdap and pneumococcal vaccines administered today  Will obtain HIV and Hep C screening at next visit

## 2019-01-02 ENCOUNTER — Telehealth: Payer: Self-pay | Admitting: *Deleted

## 2019-01-02 LAB — MICROALBUMIN / CREATININE URINE RATIO
Creatinine, Urine: 117.1 mg/dL
Microalb/Creat Ratio: 53 mg/g creat — ABNORMAL HIGH (ref 0–29)
Microalbumin, Urine: 62.6 ug/mL

## 2019-01-02 NOTE — Progress Notes (Signed)
Internal Medicine Clinic Attending  Case discussed with Dr. Bloomfield at the time of the visit.  We reviewed the resident's history and exam and pertinent patient test results.  I agree with the assessment, diagnosis, and plan of care documented in the resident's note.  Alexander Raines, M.D., Ph.D.  

## 2019-01-02 NOTE — Telephone Encounter (Signed)
The following message was received via appt on MyChart: "I was in on yesterday and received a pneumonia vaccine and since then I can't take a deep breath without pain on left side of my back and side. Is this normal?"  Call placed to patient. States she noticed difficulty taking a deep breath last evening following pneumococcal vaccine (Pneumovax 23). States when she attempts to take deep breath she has a very sharp pain in back that last only a moment but rates 10/10. Denies fever, chills. Site of injection is "sore" with "very small amount of swelling." no other symptoms. Will route to PCP and Attending Pool. Hubbard Hartshorn, RN, BSN

## 2019-01-02 NOTE — Telephone Encounter (Signed)
Upon review, patient also received TDaP yesterday. Hubbard Hartshorn, RN, BSN

## 2019-01-02 NOTE — Telephone Encounter (Signed)
Returned call to patient after speaking with Attending Rebeca Alert). Patient instructed to take warm showers and put heat to exact point of pain. Concentrate on taking shallow breaths and gradually work up to taking deeper breaths. Patient verbalized understanding. If this does not help, or if symptoms worsen, she will call back to schedule appt. She was very Patent attorney. Hubbard Hartshorn, RN, BSN

## 2019-01-07 ENCOUNTER — Encounter: Payer: Self-pay | Admitting: Internal Medicine

## 2019-01-11 DIAGNOSIS — M1711 Unilateral primary osteoarthritis, right knee: Secondary | ICD-10-CM | POA: Diagnosis not present

## 2019-01-11 DIAGNOSIS — M1712 Unilateral primary osteoarthritis, left knee: Secondary | ICD-10-CM | POA: Diagnosis not present

## 2019-01-15 ENCOUNTER — Other Ambulatory Visit: Payer: Self-pay

## 2019-01-15 ENCOUNTER — Ambulatory Visit: Payer: BC Managed Care – PPO | Attending: Internal Medicine | Admitting: Physical Therapy

## 2019-01-15 DIAGNOSIS — G8929 Other chronic pain: Secondary | ICD-10-CM | POA: Insufficient documentation

## 2019-01-15 DIAGNOSIS — M25562 Pain in left knee: Secondary | ICD-10-CM | POA: Insufficient documentation

## 2019-01-15 DIAGNOSIS — R262 Difficulty in walking, not elsewhere classified: Secondary | ICD-10-CM | POA: Insufficient documentation

## 2019-01-15 DIAGNOSIS — M25561 Pain in right knee: Secondary | ICD-10-CM | POA: Insufficient documentation

## 2019-01-15 NOTE — Patient Instructions (Signed)
Prepared By: [place], [place]  Phone: [phone]  Step 1  Step 2  Supine Active Straight Leg Raise reps: 10  sets: 2  hold: 5  daily: 2  weekly: 7 Setup  Begin lying on your back with one knee bent and your other leg straight. Movement  Engaging your thigh muscles, slowly lift your straight leg until it is parallel with your other thigh, then lower it back to the starting position and repeat. Tip  Make sure to keep your leg straight and do not let your back arch during the exercise. Step 1  Step 2  Sidelying Hip Abduction reps: 10  sets: 2  hold: 5  daily: 2  weekly: 7 Setup  Begin lying on your side with your top leg straight and your bottom leg bent. Movement  Lift your top leg up toward the ceiling, then slowly lower it back down and repeat. Tip  Make sure to keep your leg straight and do not let your hips roll backward or forward during the exercise. Step 1  Step 2  Supine Hamstring Stretch with Strap reps: 3  sets: 1  hold: 30  daily: 2  weekly: 7 Setup  Begin lying on your back with your legs straight, holding the end of a strap that is looped around one foot.  Movement  Use the strap to pull your leg up toward your body until you feel a gentle stretch in the back of your upper leg. Hold this position. Tip  Make sure to keep your other leg straight on the ground during the stretch. Step 1  Step 2  Supine Bridge reps: 10  sets: 2  hold: 5  daily: 2  weekly: 7 Setup  Begin lying on your back with your arms resting at your sides, your legs bent at the knees and your feet flat on the ground. Movement  Tighten your abdominals and slowly lift your hips off the floor into a bridge position, keeping your back straight. Tip  Make sure to keep your trunk stiff throughout the exercise and your arms flat on the floor.

## 2019-01-15 NOTE — Therapy (Signed)
[place], [place], [address] Phone: [phone]   Fax:  [phone]  Physical Therapy Evaluation  Patient Details  Name: Sheena Soto MRN: [id number] Date of Birth: 10/18/1975 Referring Provider (PT): Dr. Koleen Distance    Encounter Date: 01/15/2019  PT End of Session - 01/15/19 1305    Visit Number  1    Number of Visits  16    Date for PT Re-Evaluation  03/12/19    PT Start Time  1132    PT Stop Time  1215    PT Time Calculation (min)  43 min    Activity Tolerance  Patient tolerated treatment well    Behavior During Therapy  [facility] for tasks assessed/performed       Past Medical History:  Diagnosis Date  . Diabetes mellitus type II, non insulin dependent ([place]) 03/27/2018    Past Surgical History:  Procedure Laterality Date  . BREAST SURGERY    . CESAREAN SECTION    . HERNIA REPAIR      There were no vitals filed for this visit.   Subjective Assessment - 01/15/19 1138    Subjective  Patient presents with bilateral knee pain which is chronic and ongoing for many years.  She is here for aquatic PT.  She has weakness in bilateral legs.  She recently had cortisone injections and that helped the pain.  Pain is mostly when sitting in the [facility], sharp and then it goes away.    Pertinent History  R knee injury long ago, L ankle pain , diabetes, obesity, sprains    Limitations  Walking;Standing;Other (comment)   stairs, run , wear heels, squat   Diagnostic tests  XR done in Jan 2020    Patient Stated Goals  Patient would like to be able to have more strength, lose wgt    Currently in Pain?  Yes    Pain Score  2     Pain Orientation  Right;Left;Anterior   L>R   Pain Descriptors / Indicators  Aching;Sharp;Throbbing    Pain Type  Chronic pain    Pain Onset  More than a month ago    Pain Frequency  Intermittent    Aggravating Factors   standing, walking, squatting, cold air    Pain Relieving Factors   water, rest, sitting, keeping knees warm    Effect of Pain on Daily Activities  limis her mobility         [facility] PT Assessment - 01/15/19 0001      Assessment   Medical Diagnosis  bilateral knee pain     Referring Provider (PT)  Dr. Koleen Distance     Onset Date/Surgical Date  --   chronic    Next MD Visit  unsure     Prior Therapy  yes       Precautions   Precautions  None      Restrictions   Weight Bearing Restrictions  No      Balance Screen   Has the patient fallen in the past 6 months  No    Has the patient had a decrease in activity level because of a fear of falling?   No    Is the patient reluctant to leave their home because of a fear of falling?   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     Leisure  sleep when I can, shopping, movies, friends , go carts, water park       Cognition   Overall Cognitive Status  Within Functional Limits for tasks assessed      Observation/Other Assessments   Focus on Therapeutic Outcomes (FOTO)   48%      Sensation   Light Touch  Not tested      Squat   Comments  pain anterior knees bilateral ., shallow       Single Leg Stance   Comments  <15 sec bilateral       Posture/Postural Control   Posture/Postural Control  Postural limitations    Posture Comments  genu varus , obesity       Strength   Overall Strength Comments  knees test as 5/5     Right Hip Flexion  4/5    Right Hip ABduction  4/5    Left Hip Flexion  4/5    Left Hip ABduction  4/5      Flexibility   Hamstrings  tight bilateral       Palpation   Patella mobility  L patella grinds, min pain  hypomobile bilateral     Palpation comment  min tenderness medial joint line         Objective measurements completed on examination: See above findings.      [facility] - 01/15/19 0001      Self-Care   Self-Care  Heat/Ice Application;Other Self-Care Comments    Heat/Ice Application  ice vs heat     Other Self-Care Comments   HEP, no aquatics at this site       Knee/Hip Exercises: Stretches   Active Naval architectHamstring Stretch  Both;2 reps      Knee/Hip Exercises: Supine   Bridges  Strengthening;Both;1 set;10 reps    Straight Leg Raises  Strengthening;Both;1 set;10 reps         PT Education - 01/15/19 1305    Education Details  PT/POC, open chain vs closed chain ex, aquatics,HEP    Person(s) Educated  Patient    Methods  Explanation;Demonstration;Handout    Comprehension  Verbalized understanding;Returned demonstration           Plan - 01/15/19 1313    Clinical Impression Statement  Pt with low complexity eval of bilateral knee pain due to degenerative arthrosis and medial compartment osteophytes.  She has minimal pain overall but is concerned about the frequency of her knees buckling.  She appears to have decent strength but is overweight and pursuing bariatric surgery. She has had traditional PT without benefit and is interested in aquatics as it has helped her in the past as well.  Will make referral for [facility] program.    Personal Factors and Comorbidities  Comorbidity 1;Comorbidity 2;Past/Current Experience;Time since onset of injury/illness/exacerbation    Comorbidities  previous knee injury, obesity, diabetes    Examination-Activity Limitations  Squat;Stairs;Stand    Examination-Participation Restrictions  Interpersonal Relationship;Community Activity;Cleaning;Shop    Stability/Clinical Decision Making  Stable/Uncomplicated    Clinical Decision Making  Low    Rehab Potential  Excellent  PT Frequency  Other (comment)   TBD based on availablility of  aquatics   PT Duration  8 weeks    PT Treatment/Interventions  Aquatic Therapy;Patient/family education;Therapeutic exercise    PT Next Visit Plan  to see aquatics PT    PT Home Exercise Plan  SLR, hip flexion, abduction, hamstring flexibility. bridging       Patient will benefit from skilled therapeutic intervention in order to improve the following deficits and impairments:  Increased fascial restricitons, Pain, Postural dysfunction, Decreased mobility, Decreased strength, Obesity, Impaired flexibility, Difficulty walking  Visit Diagnosis: 1. Chronic pain of left knee   2. Chronic pain of right knee   3. Difficulty in walking, not elsewhere classified        Problem List Patient Active Problem List   Diagnosis Date Noted  . Primary osteoarthritis of both knees 01/01/2019  . Cervical cancer screening 01/01/2019  . Healthcare maintenance 01/01/2019  . Folliculitis of axilla 01/01/2019  . Ventricular tachycardia (HCC) 12/25/2018  . Anxiety 07/07/2018  . Gastroesophageal reflux disease 07/07/2018  . Palpitations 07/07/2018  . Obstructive sleep apnea 07/07/2018  . Diabetes mellitus type II, non insulin dependent (HCC) 03/27/2018    Lukas Pelcher 01/15/2019, 1:49 PM  [facility] [address] [place], [place], [id number] Phone: [phone]   Fax:  [phone]  Name: Sheena QuayShuntaneka Soto MRN: [id number] Date of Birth: 03/14/1976   Karie MainlandJennifer Kesley Gaffey, PT 01/15/19 1:50 PM Phone: [phone] Fax: [phone]

## 2019-01-16 ENCOUNTER — Ambulatory Visit: Payer: BC Managed Care – PPO

## 2019-01-23 ENCOUNTER — Encounter: Payer: Self-pay | Admitting: Internal Medicine

## 2019-01-29 ENCOUNTER — Other Ambulatory Visit (HOSPITAL_COMMUNITY)
Admission: RE | Admit: 2019-01-29 | Discharge: 2019-01-29 | Disposition: A | Discharge status: Home / Self Care | Payer: BC Managed Care – PPO | Source: Ambulatory Visit | Attending: Obstetrics and Gynecology | Admitting: Obstetrics and Gynecology

## 2019-01-29 ENCOUNTER — Encounter: Payer: Self-pay | Admitting: Obstetrics and Gynecology

## 2019-01-29 ENCOUNTER — Other Ambulatory Visit: Payer: Self-pay

## 2019-01-29 ENCOUNTER — Ambulatory Visit (INDEPENDENT_AMBULATORY_CARE_PROVIDER_SITE_OTHER): Payer: BC Managed Care – PPO | Admitting: Obstetrics and Gynecology

## 2019-01-29 VITALS — BP 130/75 | HR 90 | Ht 65.0 in | Wt 297.0 lb

## 2019-01-29 DIAGNOSIS — Z01419 Encounter for gynecological examination (general) (routine) without abnormal findings: Secondary | ICD-10-CM | POA: Diagnosis not present

## 2019-01-29 MED ORDER — NYSTATIN 100000 UNIT/GM EX CREA: 1.0000 "application " | TOPICAL_CREAM | Freq: Two times a day (BID) | CUTANEOUS | 1 refills | Status: DC

## 2019-01-29 MED ORDER — PREPLUS 27-1 MG PO TABS: 1.0000 | ORAL_TABLET | Freq: Every day | ORAL | 13 refills | Status: DC
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
## 2019-01-29 NOTE — Progress Notes (Signed)
New Patient Annual, LMP 01-19-19. Pt reports last pap 05-28-2016 at [facility] in [facility].  Pt states that she desires to get pregnant soon and wants to make sure that she is healthy. Pt desires full std testing today, and states it is time for her to have another mammogram.

## 2019-01-29 NOTE — Addendum Note (Signed)
Addended by: Mora Bellman on: 01/29/2019 04:55 PM   Modules accepted: Orders

## 2019-01-30 LAB — HEPB+HEPC+HIV PANEL
HIV Screen 4th Generation wRfx: NONREACTIVE
Hep B C IgM: NEGATIVE
Hep B Core Total Ab: NEGATIVE
Hep B E Ab: NEGATIVE
Hep B E Ag: NEGATIVE
Hep B Surface Ab, Qual: NONREACTIVE
Hep C Virus Ab: 0.1 s/co ratio (ref 0.0–0.9)
Hepatitis B Surface Ag: NEGATIVE

## 2019-01-30 LAB — RPR: RPR Ser Ql: NONREACTIVE

## 2019-01-31 LAB — CERVICOVAGINAL ANCILLARY ONLY
Bacterial vaginitis: NEGATIVE
Candida vaginitis: NEGATIVE
Chlamydia: NEGATIVE
Neisseria Gonorrhea: NEGATIVE
Trichomonas: NEGATIVE

## 2019-01-31 LAB — CYTOLOGY - PAP
Adequacy: ABSENT
Diagnosis: NEGATIVE
HPV: NOT DETECTED

## 2019-02-09 ENCOUNTER — Ambulatory Visit: Payer: BC Managed Care – PPO | Attending: Internal Medicine | Admitting: Physical Therapy

## 2019-02-16 ENCOUNTER — Ambulatory Visit: Payer: BC Managed Care – PPO | Attending: Internal Medicine | Admitting: Physical Therapy

## 2019-02-16 ENCOUNTER — Other Ambulatory Visit: Payer: Self-pay

## 2019-02-16 DIAGNOSIS — G8929 Other chronic pain: Secondary | ICD-10-CM

## 2019-02-16 DIAGNOSIS — R262 Difficulty in walking, not elsewhere classified: Secondary | ICD-10-CM | POA: Insufficient documentation

## 2019-02-16 DIAGNOSIS — M25562 Pain in left knee: Secondary | ICD-10-CM | POA: Diagnosis not present

## 2019-02-16 DIAGNOSIS — M25561 Pain in right knee: Secondary | ICD-10-CM | POA: Diagnosis not present

## 2019-02-16 NOTE — Therapy (Signed)
[facility] [address], [place], [place], [address] Phone: [phone]   Fax:  [phone]  Physical Therapy Treatment  Patient Details  Name: Saprina Chuong MRN: [id number] Date of Birth: April 22, 1976 Referring Provider (PT): Dr. Koleen Distance    Encounter Date: 02/16/2019  PT End of Session - 02/16/19 1607    Visit Number  2    Number of Visits  16    Date for PT Re-Evaluation  03/12/19    PT Start Time  1300    PT Stop Time  1345    PT Time Calculation (min)  45 min    Activity Tolerance  Patient tolerated treatment well    Behavior During Therapy  [facility] for tasks assessed/performed       Past Medical History:  Diagnosis Date  . Diabetes mellitus type II, non insulin dependent ([employer]) 03/27/2018    Past Surgical History:  Procedure Laterality Date  . BREAST SURGERY    . CESAREAN SECTION    . HERNIA REPAIR      There were no vitals filed for this visit.  Subjective Assessment - 02/16/19 1605    Subjective  Pt arrives to aquatic PT today with no pain. She is very motivated to get exercising again as she is motivated to loose weight  in order to manage her knee pain.    Pertinent History  R knee injury long ago, L ankle pain , diabetes, obesity, sprains    Diagnostic tests  XR done in Jan 2020    Patient Stated Goals  Patient would like to be able to have more strength, lose wgt    Currently in Pain?  No/denies    Multiple Pain Sites  No      Treatment: Water temp 86.7 degrees F Pt entered the water via long ramp with Bil hand rails.   Seated: Verbal review of pt status, goals for the day. PTA verbally educated/reviewed principles of the water and how she can manipulate these principles to either strengthen, support, or reduce pain. Pt verbally understood principles.  Waist deep water walking in 4 directions. Pt able to create a current  To increase resistance as she walked.   Submerged water  weights/chest height water review of pt's prior routine using the weights. Pt demo all 10x correctly.  Bicycle with large noodle x 10 minutes without stopping. No knee pain.                                     Plan - 02/16/19 1608    Clinical Impression Statement  Pt presents today for aquatic therapy. She has been very successful in the past exercising in the water. She was able to perform some high level exercises today utilizing the waters current to generate resistance. We reviewed past exercises which she ahs a good handle on and demonstrated good form. She verbally understands how to use the principles of water to strengthen her muscles and how to avoid pain.    Personal Factors and Comorbidities  Comorbidity 1;Comorbidity 2;Past/Current Experience;Time since onset of injury/illness/exacerbation    Comorbidities  previous knee injury, obesity, diabetes    Examination-Activity Limitations  Squat;Stairs;Stand    Examination-Participation Restrictions  Interpersonal Relationship;Community Activity;Cleaning;Shop    Stability/Clinical Decision Making  Stable/Uncomplicated    Rehab Potential  Excellent    PT Frequency  Other (comment)    PT Duration  8  weeks    PT Treatment/Interventions  Aquatic Therapy;Patient/family education;Therapeutic exercise    PT Next Visit Plan  Aquatics for final review of HEP.    Consulted and Agree with Plan of Care  Patient       Patient will benefit from skilled therapeutic intervention in order to improve the following deficits and impairments:  Increased fascial restricitons, Pain, Postural dysfunction, Decreased mobility, Decreased strength, Obesity, Impaired flexibility, Difficulty walking  Visit Diagnosis: Chronic pain of left knee  Chronic pain of right knee  Difficulty in walking, not elsewhere classified     Problem List Patient Active Problem List   Diagnosis Date Noted  . Primary osteoarthritis of both  knees 01/01/2019  . Cervical cancer screening 01/01/2019  . Healthcare maintenance 01/01/2019  . Folliculitis of axilla 01/01/2019  . Ventricular tachycardia (HCC) 12/25/2018  . Anxiety 07/07/2018  . Gastroesophageal reflux disease 07/07/2018  . Palpitations 07/07/2018  . Obstructive sleep apnea 07/07/2018  . Diabetes mellitus type II, non insulin dependent (HCC) 03/27/2018    ,, PTA 02/16/2019, 4:13 PM  [facility] [address], [address] [place], [place], [address] Phone: [phone]   Fax:  [phone]  Name: Theodosia QuayShuntaneka Venturi MRN: [id number] Date of Birth: 09/21/1975

## 2019-02-23 ENCOUNTER — Ambulatory Visit: Payer: BC Managed Care – PPO | Admitting: Physical Therapy

## 2019-02-23 ENCOUNTER — Encounter: Payer: Self-pay | Admitting: Physical Therapy

## 2019-02-23 ENCOUNTER — Other Ambulatory Visit: Payer: Self-pay

## 2019-02-23 DIAGNOSIS — G8929 Other chronic pain: Secondary | ICD-10-CM | POA: Diagnosis not present

## 2019-02-23 DIAGNOSIS — M25561 Pain in right knee: Secondary | ICD-10-CM | POA: Diagnosis not present

## 2019-02-23 DIAGNOSIS — R262 Difficulty in walking, not elsewhere classified: Secondary | ICD-10-CM

## 2019-02-23 DIAGNOSIS — M25562 Pain in left knee: Secondary | ICD-10-CM | POA: Diagnosis not present

## 2019-02-23 NOTE — Therapy (Addendum)
[facility] [address], [place], [place], [address] Phone: [phone]   Fax:  [phone]  Physical Therapy Treatment  Patient Details  Name: Sheena Soto MRN: [id number] Date of Birth: Jan 08, 1976 Referring Provider (PT): Dr. Koleen Distance    Encounter Date: 02/23/2019  PT End of Session - 02/23/19 1609    Visit Number  3    Number of Visits  16    Date for PT Re-Evaluation  03/12/19    PT Start Time  1300    PT Stop Time  1345    PT Time Calculation (min)  45 min    Activity Tolerance  Patient tolerated treatment well    Behavior During Therapy  [facility] for tasks assessed/performed       Past Medical History:  Diagnosis Date  . Diabetes mellitus type II, non insulin dependent ([place]) 03/27/2018    Past Surgical History:  Procedure Laterality Date  . BREAST SURGERY    . CESAREAN SECTION    . HERNIA REPAIR      There were no vitals filed for this visit.  Subjective Assessment - 02/23/19 1608    Subjective  I had no pain post aquatic therapy and I wasn't even sore.    Pertinent History  R knee injury long ago, L ankle pain , diabetes, obesity, sprains    Currently in Pain?  No/denies    Multiple Pain Sites  No      Aquatic Session: Aquatic temp 86.7 degrees. Pt entered and exited the pool via long ramp with bil hand rails.   Seated 75% submerged: Ankle, knee,hip AROM exercises with concurrent discussion of current status and how she felt after last session.   Mid chest level water walking: 2 laps each in each direction. Added increasing speed along with noodle for push/pull to increase resistance and create more current. 2 lengths of forward and back.   Single leg stance; added hip adb/add 15x Bil Single leg stance: hip circles 2x10 circling to the outside Bil Pt used noodle to aide balance.   Submerged to neck for UE water weight exercises: Breat stroke motions 10x in each direction, shoulder flexion  and extension 15x   Bicycle x 10 min with large noodle. Verbal cues for core activation                                     Plan - 02/23/19 1609    Clinical Impression Statement  Pt demonstrates good knowledge of aquatic exercises for bilateral knee pain and overall weight loss and lifelong wellness. She did not have any knee pain post her session and reports having agood week. She has contacted the point person at the [facility] to discuss scholarship opportunities as well as the opportunity of continuing to exercise at the [facility]. She is independent in her knowledge of how to use the principles of the water for either pain managment or using the resistance of the water/ creating a current for strength    Personal Factors and Comorbidities  Comorbidity 1;Comorbidity 2;Past/Current Experience;Time since onset of injury/illness/exacerbation    Comorbidities  previous knee injury, obesity, diabetes    Examination-Activity Limitations  Squat;Stairs;Stand    Examination-Participation Restrictions  Interpersonal Relationship;Community Activity;Cleaning;Shop    Stability/Clinical Decision Making  Stable/Uncomplicated    Rehab Potential  Excellent    PT Frequency  Other (comment)    PT Duration  8  weeks    PT Treatment/Interventions  Aquatic Therapy;Patient/family education;Therapeutic exercise    PT Next Visit Plan  Call pt on Monday to discuss if she wants to come into clinic for re-evaluation to assess further needs.    PT Home Exercise Plan  SLR, hip flexion, abduction, hamstring flexibility. bridging    Consulted and Agree with Plan of Care  Patient       Patient will benefit from skilled therapeutic intervention in order to improve the following deficits and impairments:  Increased fascial restricitons, Pain, Postural dysfunction, Decreased mobility, Decreased strength, Obesity, Impaired flexibility, Difficulty walking  Visit Diagnosis: Chronic pain of left  knee  Chronic pain of right knee  Difficulty in walking, not elsewhere classified     Problem List Patient Active Problem List   Diagnosis Date Noted  . Primary osteoarthritis of both knees 01/01/2019  . Cervical cancer screening 01/01/2019  . Healthcare maintenance 01/01/2019  . Folliculitis of axilla 00/44/7158  . Ventricular tachycardia ([place]) 12/25/2018  . Anxiety 07/07/2018  . Gastroesophageal reflux disease 07/07/2018  . Palpitations 07/07/2018  . Obstructive sleep apnea 07/07/2018  . Diabetes mellitus type II, non insulin dependent ([place]) 03/27/2018    Alquan Morrish, PTA 02/23/2019, 4:16 PM  [facility] [address] [place], [place], [address] Phone: [phone]   Fax:  [phone]  Name: Sheena Soto MRN: [id number] Date of Birth: Aug 16, 1975  PHYSICAL THERAPY DISCHARGE SUMMARY  Visits from Start of Care: 3  Current functional level related to goals / functional outcomes: Unknown, see above    Remaining deficits: Unknown  Education / Equipment: HEP, pool  Plan: Patient agrees to discharge.  Patient goals were not met. Patient is being discharged due to not returning since the last visit.  ?????    Raeford Razor, PT 08/21/19 8:18 AM Phone: [phone] Fax: [phone]

## 2019-03-02 ENCOUNTER — Ambulatory Visit: Payer: BC Managed Care – PPO | Admitting: Physical Therapy

## 2019-03-05 ENCOUNTER — Telehealth: Payer: Self-pay | Admitting: Physical Therapy

## 2019-03-05 NOTE — Telephone Encounter (Signed)
PTA called pt for missed aquatic session on Friday. Pt states she had her appointments for the day messed up and just forgot about it. She will attend her last session this Friday.   Myrene Galas, PTA @TODAY @ 9:30 AM

## 2019-03-09 ENCOUNTER — Ambulatory Visit: Payer: BC Managed Care – PPO | Admitting: Physical Therapy

## 2019-03-15 ENCOUNTER — Ambulatory Visit
Admission: RE | Admit: 2019-03-15 | Discharge: 2019-03-15 | Disposition: A | Discharge status: Home / Self Care | Payer: BC Managed Care – PPO | Source: Ambulatory Visit | Attending: Obstetrics and Gynecology | Admitting: Obstetrics and Gynecology

## 2019-03-15 ENCOUNTER — Other Ambulatory Visit: Payer: Self-pay

## 2019-03-15 DIAGNOSIS — Z01419 Encounter for gynecological examination (general) (routine) without abnormal findings: Secondary | ICD-10-CM

## 2019-03-15 DIAGNOSIS — Z1231 Encounter for screening mammogram for malignant neoplasm of breast: Secondary | ICD-10-CM | POA: Diagnosis not present

## 2019-03-21 DIAGNOSIS — M9905 Segmental and somatic dysfunction of pelvic region: Secondary | ICD-10-CM | POA: Diagnosis not present

## 2019-03-21 DIAGNOSIS — M50322 Other cervical disc degeneration at C5-C6 level: Secondary | ICD-10-CM | POA: Diagnosis not present

## 2019-03-21 DIAGNOSIS — M545 Low back pain: Secondary | ICD-10-CM | POA: Diagnosis not present

## 2019-03-21 DIAGNOSIS — M9901 Segmental and somatic dysfunction of cervical region: Secondary | ICD-10-CM | POA: Diagnosis not present

## 2019-03-26 DIAGNOSIS — M9901 Segmental and somatic dysfunction of cervical region: Secondary | ICD-10-CM | POA: Diagnosis not present

## 2019-03-26 DIAGNOSIS — M50322 Other cervical disc degeneration at C5-C6 level: Secondary | ICD-10-CM | POA: Diagnosis not present

## 2019-03-26 DIAGNOSIS — M9905 Segmental and somatic dysfunction of pelvic region: Secondary | ICD-10-CM | POA: Diagnosis not present

## 2019-03-26 DIAGNOSIS — M545 Low back pain: Secondary | ICD-10-CM | POA: Diagnosis not present

## 2019-03-28 ENCOUNTER — Other Ambulatory Visit: Payer: Self-pay | Admitting: Internal Medicine

## 2019-03-28 DIAGNOSIS — M9901 Segmental and somatic dysfunction of cervical region: Secondary | ICD-10-CM | POA: Diagnosis not present

## 2019-03-28 DIAGNOSIS — M50322 Other cervical disc degeneration at C5-C6 level: Secondary | ICD-10-CM | POA: Diagnosis not present

## 2019-03-28 DIAGNOSIS — M545 Low back pain: Secondary | ICD-10-CM | POA: Diagnosis not present

## 2019-03-28 DIAGNOSIS — E119 Type 2 diabetes mellitus without complications: Secondary | ICD-10-CM

## 2019-03-28 DIAGNOSIS — M9905 Segmental and somatic dysfunction of pelvic region: Secondary | ICD-10-CM | POA: Diagnosis not present

## 2019-03-28 NOTE — Telephone Encounter (Signed)
?   Pt taking 500 mg or 1000 mg - see 8/11 encounter.

## 2019-03-29 DIAGNOSIS — M9905 Segmental and somatic dysfunction of pelvic region: Secondary | ICD-10-CM | POA: Diagnosis not present

## 2019-03-29 DIAGNOSIS — M9901 Segmental and somatic dysfunction of cervical region: Secondary | ICD-10-CM | POA: Diagnosis not present

## 2019-03-29 DIAGNOSIS — M50322 Other cervical disc degeneration at C5-C6 level: Secondary | ICD-10-CM | POA: Diagnosis not present

## 2019-03-29 DIAGNOSIS — M545 Low back pain: Secondary | ICD-10-CM | POA: Diagnosis not present

## 2019-04-02 DIAGNOSIS — M545 Low back pain: Secondary | ICD-10-CM | POA: Diagnosis not present

## 2019-04-02 DIAGNOSIS — M9901 Segmental and somatic dysfunction of cervical region: Secondary | ICD-10-CM | POA: Diagnosis not present

## 2019-04-02 DIAGNOSIS — M9905 Segmental and somatic dysfunction of pelvic region: Secondary | ICD-10-CM | POA: Diagnosis not present

## 2019-04-02 DIAGNOSIS — M50322 Other cervical disc degeneration at C5-C6 level: Secondary | ICD-10-CM | POA: Diagnosis not present

## 2019-04-04 DIAGNOSIS — M9905 Segmental and somatic dysfunction of pelvic region: Secondary | ICD-10-CM | POA: Diagnosis not present

## 2019-04-04 DIAGNOSIS — M545 Low back pain: Secondary | ICD-10-CM | POA: Diagnosis not present

## 2019-04-04 DIAGNOSIS — M9901 Segmental and somatic dysfunction of cervical region: Secondary | ICD-10-CM | POA: Diagnosis not present

## 2019-04-04 DIAGNOSIS — M50322 Other cervical disc degeneration at C5-C6 level: Secondary | ICD-10-CM | POA: Diagnosis not present

## 2019-04-05 DIAGNOSIS — M9905 Segmental and somatic dysfunction of pelvic region: Secondary | ICD-10-CM | POA: Diagnosis not present

## 2019-04-05 DIAGNOSIS — M545 Low back pain: Secondary | ICD-10-CM | POA: Diagnosis not present

## 2019-04-05 DIAGNOSIS — M50322 Other cervical disc degeneration at C5-C6 level: Secondary | ICD-10-CM | POA: Diagnosis not present

## 2019-04-05 DIAGNOSIS — M9901 Segmental and somatic dysfunction of cervical region: Secondary | ICD-10-CM | POA: Diagnosis not present

## 2019-04-08 IMAGING — CR DG KNEE 1-2V*R*
2 series · 2 of 2 positions shown · non-contrast
Comparison: None.

CLINICAL DATA: Chronic knee pain, right greater than left

EXAM:
RIGHT KNEE - 1-2 VIEW; LEFT KNEE - 1-2 VIEW

[w knee ap right]
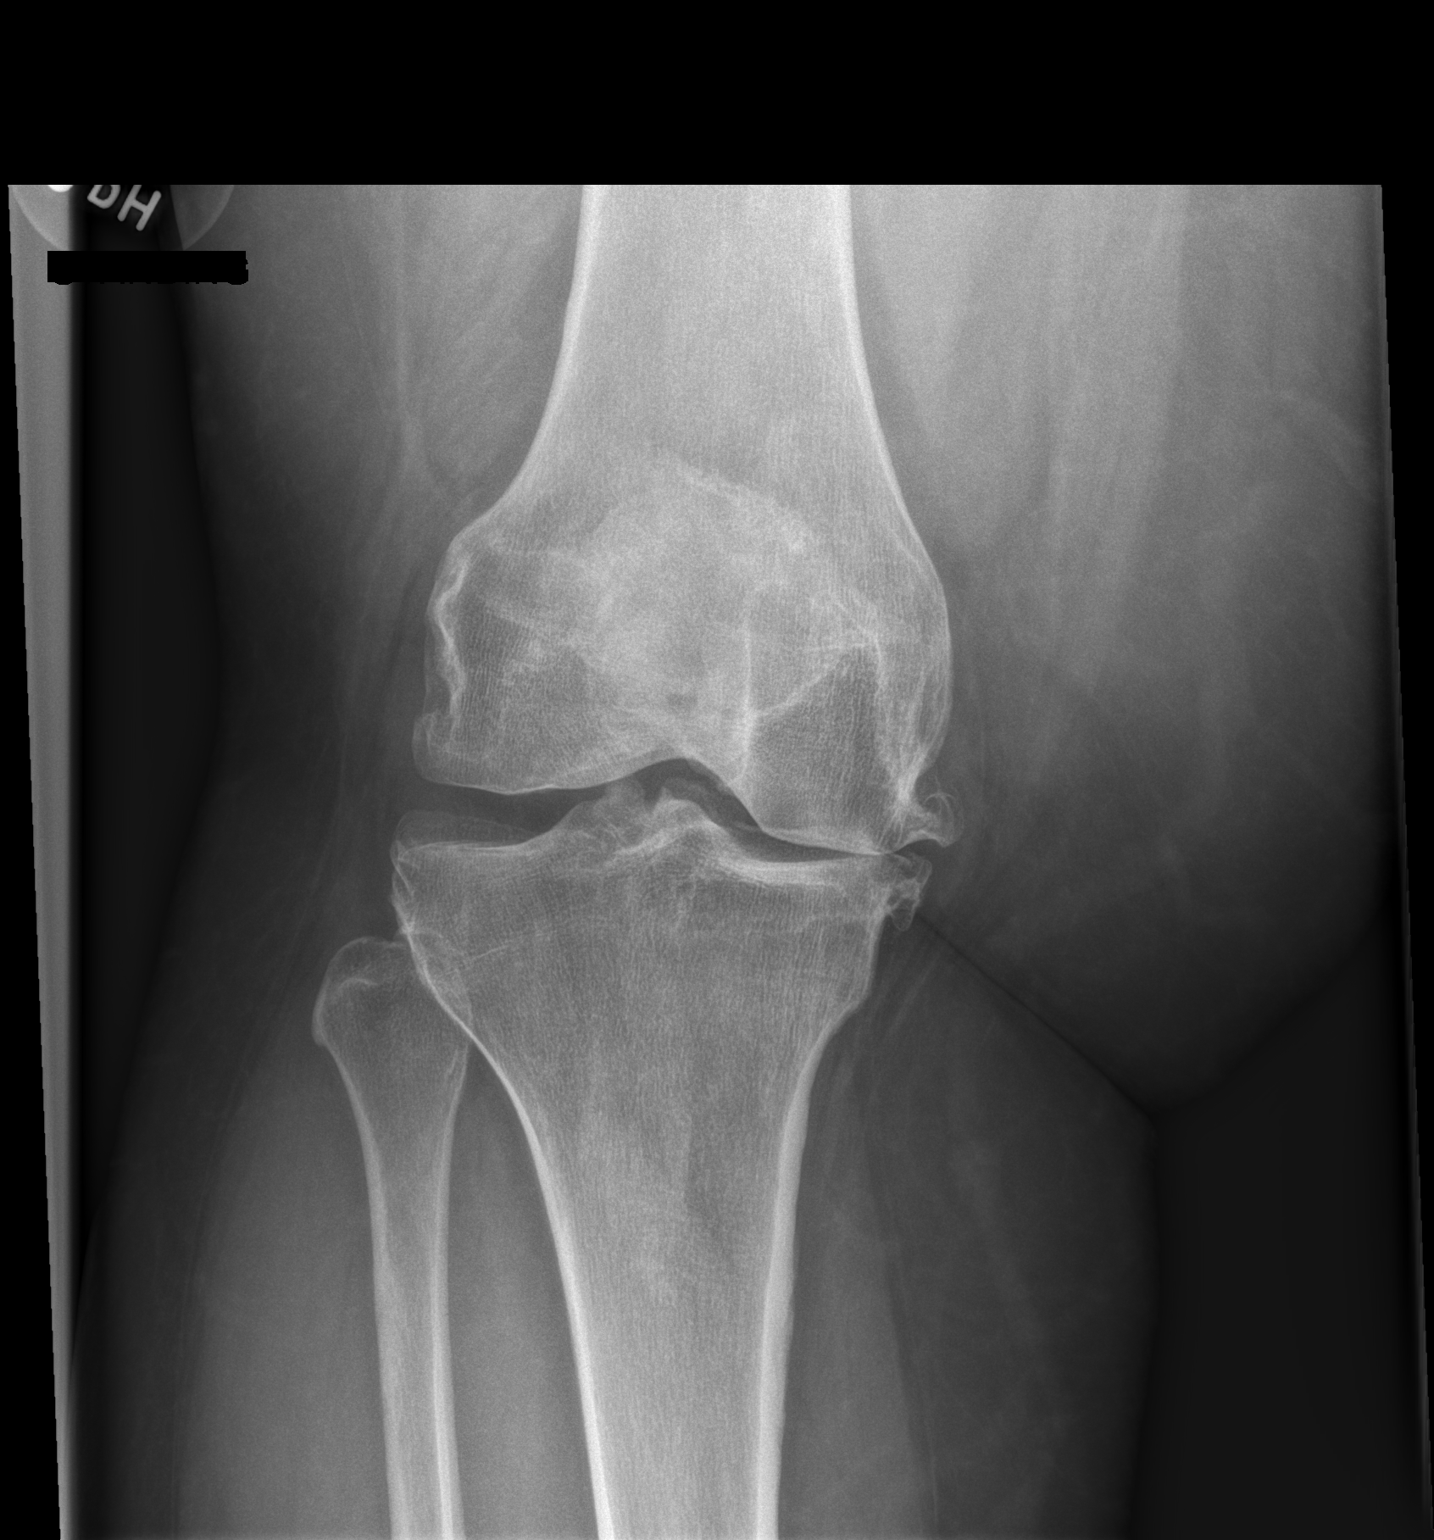

[w knee lat right]
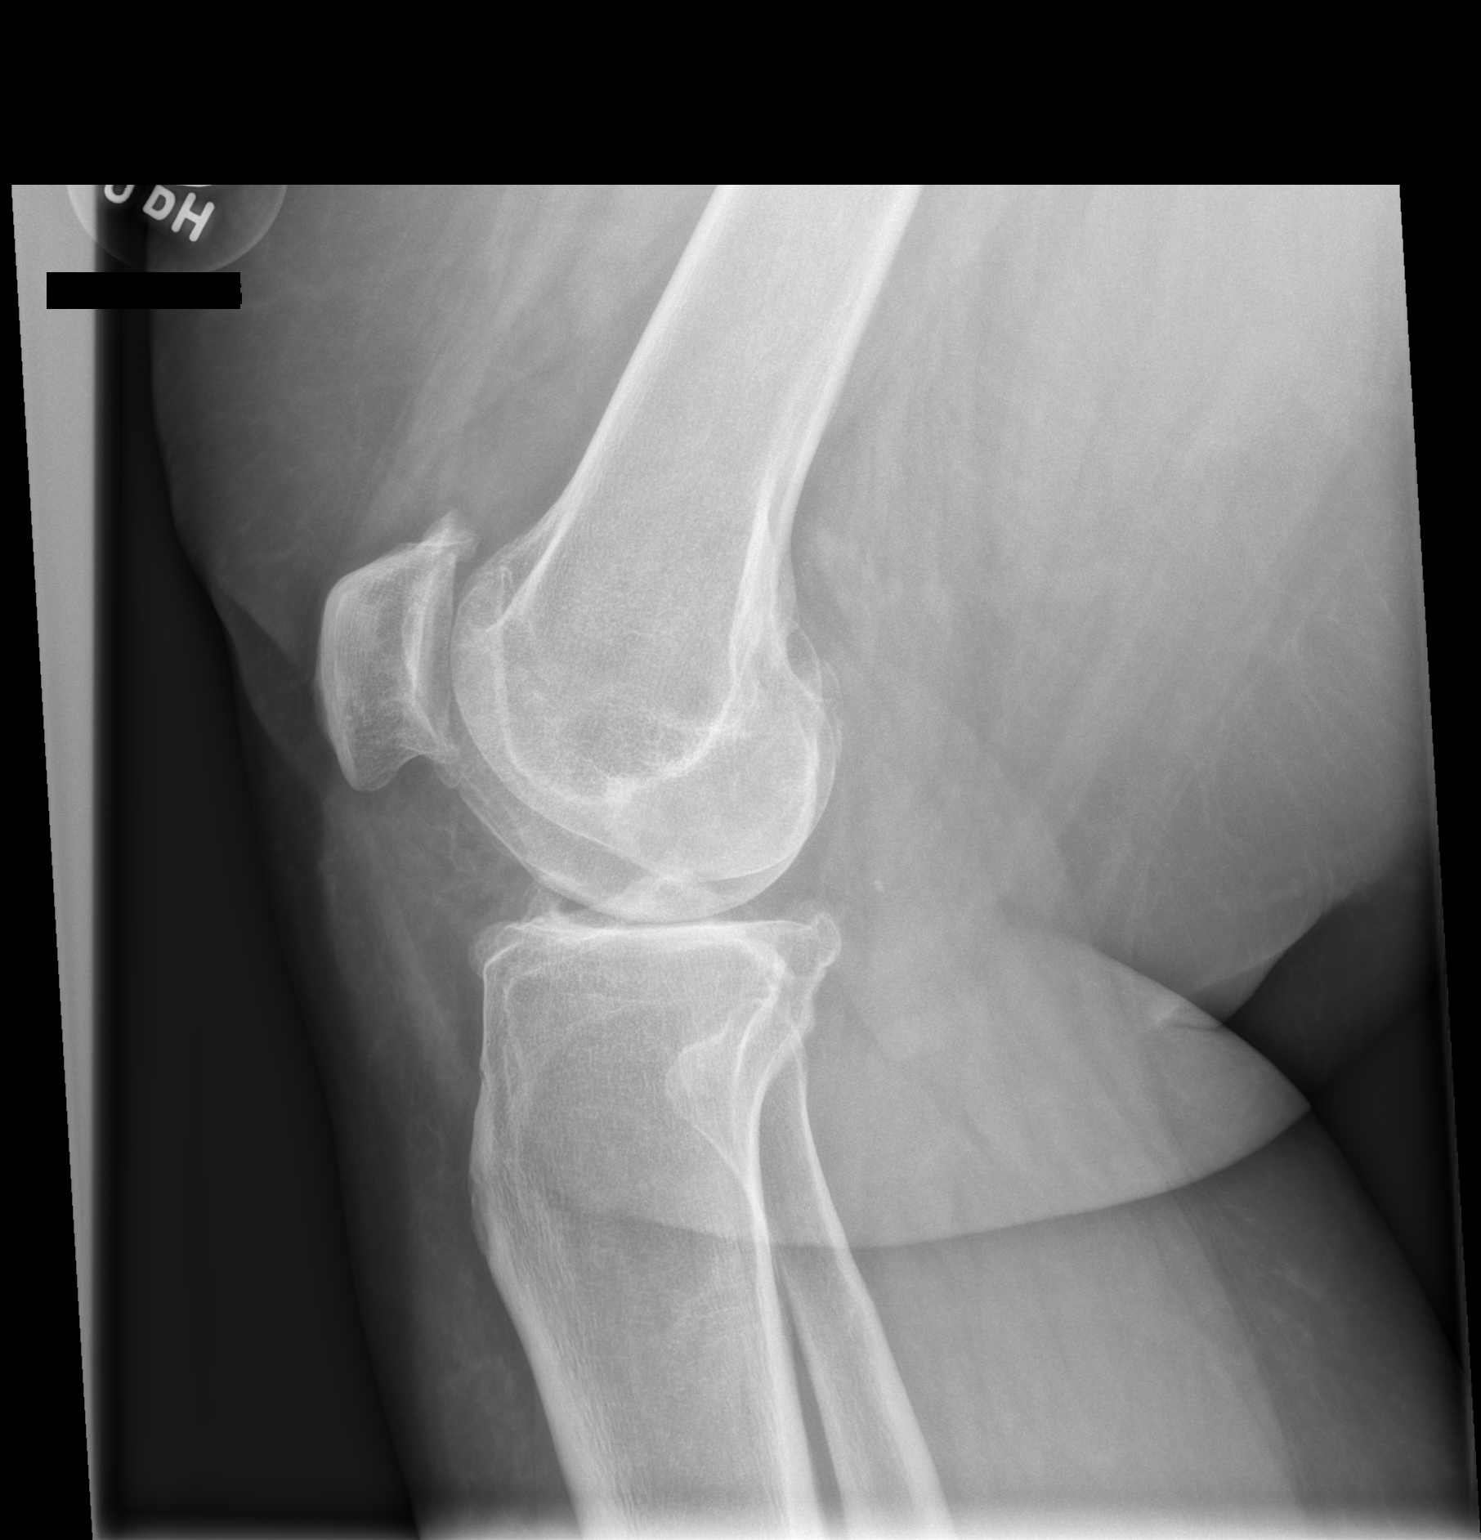

[2 of 2 positions shown; findings below may reference images not displayed]

FINDINGS: No fracture or dislocation. There is severe bilateral arthrosis,
with near-total bone-on-bone joint space loss and large osteophytes
of the medial compartments bilaterally and moderate osteophytosis of
the patellofemoral compartments bilaterally. There are small
bilateral nonspecific knee joint effusions. Soft tissues are
unremarkable.
IMPRESSION: No fracture or dislocation. There is severe bilateral arthrosis,
with near-total bone-on-bone joint space loss and large osteophytes
of the medial compartments bilaterally and moderate osteophytosis of
the patellofemoral compartments bilaterally. There are small
bilateral nonspecific knee joint effusions.

## 2019-04-09 DIAGNOSIS — M545 Low back pain: Secondary | ICD-10-CM | POA: Diagnosis not present

## 2019-04-09 DIAGNOSIS — M50322 Other cervical disc degeneration at C5-C6 level: Secondary | ICD-10-CM | POA: Diagnosis not present

## 2019-04-09 DIAGNOSIS — M9901 Segmental and somatic dysfunction of cervical region: Secondary | ICD-10-CM | POA: Diagnosis not present

## 2019-04-09 DIAGNOSIS — M9905 Segmental and somatic dysfunction of pelvic region: Secondary | ICD-10-CM | POA: Diagnosis not present

## 2019-04-11 DIAGNOSIS — M545 Low back pain: Secondary | ICD-10-CM | POA: Diagnosis not present

## 2019-04-11 DIAGNOSIS — M50322 Other cervical disc degeneration at C5-C6 level: Secondary | ICD-10-CM | POA: Diagnosis not present

## 2019-04-11 DIAGNOSIS — M9901 Segmental and somatic dysfunction of cervical region: Secondary | ICD-10-CM | POA: Diagnosis not present

## 2019-04-11 DIAGNOSIS — M9905 Segmental and somatic dysfunction of pelvic region: Secondary | ICD-10-CM | POA: Diagnosis not present

## 2019-04-12 DIAGNOSIS — M9901 Segmental and somatic dysfunction of cervical region: Secondary | ICD-10-CM | POA: Diagnosis not present

## 2019-04-12 DIAGNOSIS — M545 Low back pain: Secondary | ICD-10-CM | POA: Diagnosis not present

## 2019-04-12 DIAGNOSIS — M9905 Segmental and somatic dysfunction of pelvic region: Secondary | ICD-10-CM | POA: Diagnosis not present

## 2019-04-12 DIAGNOSIS — M50322 Other cervical disc degeneration at C5-C6 level: Secondary | ICD-10-CM | POA: Diagnosis not present

## 2019-04-18 DIAGNOSIS — M9901 Segmental and somatic dysfunction of cervical region: Secondary | ICD-10-CM | POA: Diagnosis not present

## 2019-04-18 DIAGNOSIS — M9905 Segmental and somatic dysfunction of pelvic region: Secondary | ICD-10-CM | POA: Diagnosis not present

## 2019-04-18 DIAGNOSIS — M545 Low back pain: Secondary | ICD-10-CM | POA: Diagnosis not present

## 2019-04-18 DIAGNOSIS — M50322 Other cervical disc degeneration at C5-C6 level: Secondary | ICD-10-CM | POA: Diagnosis not present

## 2019-04-19 DIAGNOSIS — M545 Low back pain: Secondary | ICD-10-CM | POA: Diagnosis not present

## 2019-04-19 DIAGNOSIS — M50322 Other cervical disc degeneration at C5-C6 level: Secondary | ICD-10-CM | POA: Diagnosis not present

## 2019-04-19 DIAGNOSIS — M9905 Segmental and somatic dysfunction of pelvic region: Secondary | ICD-10-CM | POA: Diagnosis not present

## 2019-04-19 DIAGNOSIS — M9901 Segmental and somatic dysfunction of cervical region: Secondary | ICD-10-CM | POA: Diagnosis not present

## 2019-04-23 DIAGNOSIS — M50322 Other cervical disc degeneration at C5-C6 level: Secondary | ICD-10-CM | POA: Diagnosis not present

## 2019-04-23 DIAGNOSIS — M9901 Segmental and somatic dysfunction of cervical region: Secondary | ICD-10-CM | POA: Diagnosis not present

## 2019-04-23 DIAGNOSIS — M9905 Segmental and somatic dysfunction of pelvic region: Secondary | ICD-10-CM | POA: Diagnosis not present

## 2019-04-23 DIAGNOSIS — M545 Low back pain: Secondary | ICD-10-CM | POA: Diagnosis not present

## 2019-04-25 DIAGNOSIS — M545 Low back pain: Secondary | ICD-10-CM | POA: Diagnosis not present

## 2019-04-25 DIAGNOSIS — M50322 Other cervical disc degeneration at C5-C6 level: Secondary | ICD-10-CM | POA: Diagnosis not present

## 2019-04-25 DIAGNOSIS — M9901 Segmental and somatic dysfunction of cervical region: Secondary | ICD-10-CM | POA: Diagnosis not present

## 2019-04-25 DIAGNOSIS — M9905 Segmental and somatic dysfunction of pelvic region: Secondary | ICD-10-CM | POA: Diagnosis not present

## 2019-05-07 DIAGNOSIS — M545 Low back pain: Secondary | ICD-10-CM | POA: Diagnosis not present

## 2019-05-07 DIAGNOSIS — M50322 Other cervical disc degeneration at C5-C6 level: Secondary | ICD-10-CM | POA: Diagnosis not present

## 2019-05-07 DIAGNOSIS — M9901 Segmental and somatic dysfunction of cervical region: Secondary | ICD-10-CM | POA: Diagnosis not present

## 2019-05-07 DIAGNOSIS — M9905 Segmental and somatic dysfunction of pelvic region: Secondary | ICD-10-CM | POA: Diagnosis not present

## 2019-05-08 DIAGNOSIS — M9905 Segmental and somatic dysfunction of pelvic region: Secondary | ICD-10-CM | POA: Diagnosis not present

## 2019-05-08 DIAGNOSIS — M9901 Segmental and somatic dysfunction of cervical region: Secondary | ICD-10-CM | POA: Diagnosis not present

## 2019-05-08 DIAGNOSIS — M545 Low back pain: Secondary | ICD-10-CM | POA: Diagnosis not present

## 2019-05-08 DIAGNOSIS — M50322 Other cervical disc degeneration at C5-C6 level: Secondary | ICD-10-CM | POA: Diagnosis not present

## 2019-05-09 DIAGNOSIS — M9905 Segmental and somatic dysfunction of pelvic region: Secondary | ICD-10-CM | POA: Diagnosis not present

## 2019-05-09 DIAGNOSIS — M545 Low back pain: Secondary | ICD-10-CM | POA: Diagnosis not present

## 2019-05-09 DIAGNOSIS — M50322 Other cervical disc degeneration at C5-C6 level: Secondary | ICD-10-CM | POA: Diagnosis not present

## 2019-05-09 DIAGNOSIS — M9901 Segmental and somatic dysfunction of cervical region: Secondary | ICD-10-CM | POA: Diagnosis not present

## 2019-05-14 DIAGNOSIS — M9905 Segmental and somatic dysfunction of pelvic region: Secondary | ICD-10-CM | POA: Diagnosis not present

## 2019-05-14 DIAGNOSIS — M545 Low back pain: Secondary | ICD-10-CM | POA: Diagnosis not present

## 2019-05-14 DIAGNOSIS — M50322 Other cervical disc degeneration at C5-C6 level: Secondary | ICD-10-CM | POA: Diagnosis not present

## 2019-05-14 DIAGNOSIS — M9901 Segmental and somatic dysfunction of cervical region: Secondary | ICD-10-CM | POA: Diagnosis not present

## 2019-05-16 DIAGNOSIS — M545 Low back pain: Secondary | ICD-10-CM | POA: Diagnosis not present

## 2019-05-16 DIAGNOSIS — M9901 Segmental and somatic dysfunction of cervical region: Secondary | ICD-10-CM | POA: Diagnosis not present

## 2019-05-16 DIAGNOSIS — M50322 Other cervical disc degeneration at C5-C6 level: Secondary | ICD-10-CM | POA: Diagnosis not present

## 2019-05-16 DIAGNOSIS — M9905 Segmental and somatic dysfunction of pelvic region: Secondary | ICD-10-CM | POA: Diagnosis not present

## 2019-05-17 DIAGNOSIS — M50322 Other cervical disc degeneration at C5-C6 level: Secondary | ICD-10-CM | POA: Diagnosis not present

## 2019-05-17 DIAGNOSIS — M9905 Segmental and somatic dysfunction of pelvic region: Secondary | ICD-10-CM | POA: Diagnosis not present

## 2019-05-17 DIAGNOSIS — M545 Low back pain: Secondary | ICD-10-CM | POA: Diagnosis not present

## 2019-05-17 DIAGNOSIS — M9901 Segmental and somatic dysfunction of cervical region: Secondary | ICD-10-CM | POA: Diagnosis not present

## 2019-05-18 DIAGNOSIS — Z7189 Other specified counseling: Secondary | ICD-10-CM | POA: Diagnosis not present

## 2019-05-18 DIAGNOSIS — R519 Headache, unspecified: Secondary | ICD-10-CM | POA: Diagnosis not present

## 2019-05-18 DIAGNOSIS — Z20828 Contact with and (suspected) exposure to other viral communicable diseases: Secondary | ICD-10-CM | POA: Diagnosis not present

## 2019-05-21 ENCOUNTER — Telehealth: Payer: Self-pay | Admitting: *Deleted

## 2019-05-21 DIAGNOSIS — M50322 Other cervical disc degeneration at C5-C6 level: Secondary | ICD-10-CM | POA: Diagnosis not present

## 2019-05-21 DIAGNOSIS — M9905 Segmental and somatic dysfunction of pelvic region: Secondary | ICD-10-CM | POA: Diagnosis not present

## 2019-05-21 DIAGNOSIS — M545 Low back pain: Secondary | ICD-10-CM | POA: Diagnosis not present

## 2019-05-21 DIAGNOSIS — M9901 Segmental and somatic dysfunction of cervical region: Secondary | ICD-10-CM | POA: Diagnosis not present

## 2019-05-21 NOTE — Telephone Encounter (Signed)
Received message from the front offfice - pt stated she had been around someone who tested positive. I called pt- stated that was an old message from last week; and since that time, she has been tested which was negative.Pt has no further concerns. She requested to schedule an appt w/her PCP for diabetes f/u. Call transferred to front office - appt scheduled 06/18/19.

## 2019-05-28 DIAGNOSIS — M545 Low back pain: Secondary | ICD-10-CM | POA: Diagnosis not present

## 2019-05-28 DIAGNOSIS — M9905 Segmental and somatic dysfunction of pelvic region: Secondary | ICD-10-CM | POA: Diagnosis not present

## 2019-05-28 DIAGNOSIS — M50322 Other cervical disc degeneration at C5-C6 level: Secondary | ICD-10-CM | POA: Diagnosis not present

## 2019-05-28 DIAGNOSIS — M9901 Segmental and somatic dysfunction of cervical region: Secondary | ICD-10-CM | POA: Diagnosis not present

## 2019-05-30 DIAGNOSIS — M9901 Segmental and somatic dysfunction of cervical region: Secondary | ICD-10-CM | POA: Diagnosis not present

## 2019-05-30 DIAGNOSIS — M545 Low back pain: Secondary | ICD-10-CM | POA: Diagnosis not present

## 2019-05-30 DIAGNOSIS — M50322 Other cervical disc degeneration at C5-C6 level: Secondary | ICD-10-CM | POA: Diagnosis not present

## 2019-05-30 DIAGNOSIS — M9905 Segmental and somatic dysfunction of pelvic region: Secondary | ICD-10-CM | POA: Diagnosis not present

## 2019-06-04 DIAGNOSIS — M9901 Segmental and somatic dysfunction of cervical region: Secondary | ICD-10-CM | POA: Diagnosis not present

## 2019-06-04 DIAGNOSIS — M545 Low back pain: Secondary | ICD-10-CM | POA: Diagnosis not present

## 2019-06-04 DIAGNOSIS — M50322 Other cervical disc degeneration at C5-C6 level: Secondary | ICD-10-CM | POA: Diagnosis not present

## 2019-06-04 DIAGNOSIS — M9905 Segmental and somatic dysfunction of pelvic region: Secondary | ICD-10-CM | POA: Diagnosis not present

## 2019-06-06 DIAGNOSIS — M50322 Other cervical disc degeneration at C5-C6 level: Secondary | ICD-10-CM | POA: Diagnosis not present

## 2019-06-06 DIAGNOSIS — M545 Low back pain: Secondary | ICD-10-CM | POA: Diagnosis not present

## 2019-06-06 DIAGNOSIS — M9901 Segmental and somatic dysfunction of cervical region: Secondary | ICD-10-CM | POA: Diagnosis not present

## 2019-06-06 DIAGNOSIS — M9905 Segmental and somatic dysfunction of pelvic region: Secondary | ICD-10-CM | POA: Diagnosis not present

## 2019-06-11 DIAGNOSIS — M9901 Segmental and somatic dysfunction of cervical region: Secondary | ICD-10-CM | POA: Diagnosis not present

## 2019-06-11 DIAGNOSIS — M9905 Segmental and somatic dysfunction of pelvic region: Secondary | ICD-10-CM | POA: Diagnosis not present

## 2019-06-11 DIAGNOSIS — M545 Low back pain: Secondary | ICD-10-CM | POA: Diagnosis not present

## 2019-06-11 DIAGNOSIS — M50322 Other cervical disc degeneration at C5-C6 level: Secondary | ICD-10-CM | POA: Diagnosis not present

## 2019-06-12 ENCOUNTER — Other Ambulatory Visit: Payer: Self-pay | Admitting: Internal Medicine

## 2019-06-12 DIAGNOSIS — E119 Type 2 diabetes mellitus without complications: Secondary | ICD-10-CM

## 2019-06-18 ENCOUNTER — Ambulatory Visit (INDEPENDENT_AMBULATORY_CARE_PROVIDER_SITE_OTHER): Payer: BC Managed Care – PPO | Admitting: Internal Medicine

## 2019-06-18 ENCOUNTER — Other Ambulatory Visit: Payer: Self-pay

## 2019-06-18 ENCOUNTER — Encounter: Payer: Self-pay | Admitting: Internal Medicine

## 2019-06-18 VITALS — BP 135/82 | HR 99 | Temp 98.4°F | Wt 304.1 lb

## 2019-06-18 DIAGNOSIS — Z6841 Body Mass Index (BMI) 40.0 and over, adult: Secondary | ICD-10-CM | POA: Diagnosis not present

## 2019-06-18 DIAGNOSIS — M9905 Segmental and somatic dysfunction of pelvic region: Secondary | ICD-10-CM | POA: Diagnosis not present

## 2019-06-18 DIAGNOSIS — M50322 Other cervical disc degeneration at C5-C6 level: Secondary | ICD-10-CM | POA: Diagnosis not present

## 2019-06-18 DIAGNOSIS — M545 Low back pain: Secondary | ICD-10-CM | POA: Diagnosis not present

## 2019-06-18 DIAGNOSIS — E119 Type 2 diabetes mellitus without complications: Secondary | ICD-10-CM

## 2019-06-18 DIAGNOSIS — M9901 Segmental and somatic dysfunction of cervical region: Secondary | ICD-10-CM | POA: Diagnosis not present

## 2019-06-18 DIAGNOSIS — Z0000 Encounter for general adult medical examination without abnormal findings: Secondary | ICD-10-CM

## 2019-06-18 DIAGNOSIS — Z7984 Long term (current) use of oral hypoglycemic drugs: Secondary | ICD-10-CM | POA: Diagnosis not present

## 2019-06-18 LAB — POCT GLYCOSYLATED HEMOGLOBIN (HGB A1C): Hemoglobin A1C: 6.6 % — AB (ref 4.0–5.6)

## 2019-06-18 LAB — GLUCOSE, CAPILLARY: Glucose-Capillary: 98 mg/dL (ref 70–99)

## 2019-06-18 LAB — HM DIABETES EYE EXAM

## 2019-06-18 NOTE — Patient Instructions (Signed)
Ms. Weissman,  It was great seeing you! So glad to hear about your wonderful holiday and that you're doing well.  You have an exciting year ahead! Congratulations on the upcoming wedding.   Keep up the great work with your diabetes! Your A1C improved from 7.7 to 6.6 which is great! We will continue your Metformin 500 mg twice daily like you've been doing. We are also getting your annual diabetic eye exam while you're here today.  You should be hearing from our diabetes educator, Lupita Leash, who will help advise you on nutrition planning and sugar control while trying to become pregnant.   Take care and see you in about 3 months! Dr. Chesley Mires

## 2019-06-20 DIAGNOSIS — M9901 Segmental and somatic dysfunction of cervical region: Secondary | ICD-10-CM | POA: Diagnosis not present

## 2019-06-20 DIAGNOSIS — M545 Low back pain: Secondary | ICD-10-CM | POA: Diagnosis not present

## 2019-06-20 DIAGNOSIS — M50322 Other cervical disc degeneration at C5-C6 level: Secondary | ICD-10-CM | POA: Diagnosis not present

## 2019-06-20 DIAGNOSIS — M9905 Segmental and somatic dysfunction of pelvic region: Secondary | ICD-10-CM | POA: Diagnosis not present

## 2019-06-21 ENCOUNTER — Encounter (INDEPENDENT_AMBULATORY_CARE_PROVIDER_SITE_OTHER): Payer: BC Managed Care – PPO | Admitting: Dietician

## 2019-06-21 DIAGNOSIS — E119 Type 2 diabetes mellitus without complications: Secondary | ICD-10-CM | POA: Diagnosis not present

## 2019-06-23 ENCOUNTER — Encounter: Payer: Self-pay | Admitting: Internal Medicine

## 2019-06-23 NOTE — Progress Notes (Signed)
   CC: DM  HPI:  [name] is a 44 y.o. female with history of morbid obesity and type II DM who presents for follow-up on her Diabetes. Please see problem based charting for details of today's visit.   Past Medical History:  Diagnosis Date  . Diabetes mellitus type II, non insulin dependent (HCC) 03/27/2018   Review of Systems:   Review of Systems  Constitutional: Negative for chills, fever and malaise/fatigue.  Eyes: Negative for blurred vision.  Respiratory: Negative for shortness of breath.   Cardiovascular: Negative for chest pain and palpitations.  Gastrointestinal: Negative for abdominal pain, constipation, diarrhea, nausea and vomiting.  Genitourinary: Negative for dysuria and hematuria.  Musculoskeletal: Negative for falls and myalgias.  Neurological: Negative for dizziness and headaches.  Endo/Heme/Allergies: Negative for polydipsia.  Psychiatric/Behavioral: Negative for depression.    Physical Exam:  Vitals:   06/18/19 1350  BP: 135/82  Pulse: 99  Temp: 98.4 F (36.9 C)  TempSrc: Oral  SpO2: 98%  Weight: (!) 304 lb 1.6 oz (137.9 kg)   Physical Exam Vitals reviewed.  Constitutional:      Appearance: She is obese.  Eyes:     Conjunctiva/sclera: Conjunctivae normal.  Cardiovascular:     Rate and Rhythm: Normal rate and regular rhythm.  Pulmonary:     Effort: Pulmonary effort is normal.     Breath sounds: Normal breath sounds.  Abdominal:     General: Bowel sounds are normal. There is no distension.     Palpations: Abdomen is soft.     Tenderness: There is no abdominal tenderness.  Musculoskeletal:     Cervical back: Neck supple.     Right lower leg: No edema.     Left lower leg: No edema.  Lymphadenopathy:     Cervical: No cervical adenopathy.  Skin:    General: Skin is warm and dry.  Neurological:     General: No focal deficit present.     Mental Status: She is oriented to person, place, and time.  Psychiatric:        Mood and  Affect: Mood normal.        Behavior: Behavior normal.      Assessment & Plan:   See Encounters Tab for problem based charting.  Patient discussed with Dr. Antony Contras

## 2019-06-23 NOTE — Assessment & Plan Note (Deleted)
Flu vaccine administered today.

## 2019-06-23 NOTE — Assessment & Plan Note (Signed)
HIV and Hep C done at recent GYN appt.  Will administer flu shot today.

## 2019-06-23 NOTE — Assessment & Plan Note (Signed)
Ms. Yarbro has been working hard to make necessary diet changes. Notes her average glucose readings are 110-120 which correlates with improvement in A1C form 7.7>6.6. Currently on Metformin 500 mg BID. She did not tolerate further up-titration. Given that she is demonstrating good glycemic control, will continue current regimen. She is due for eye exam, which was performed here in clinic today.  Patient also expresses desire to to become later this year after she gets married in April. She was interested in referral to Lupita Leash for nutritional counseling and to ensure optimal glycemic control.   Follow-up in 3 months

## 2019-06-25 DIAGNOSIS — M545 Low back pain: Secondary | ICD-10-CM | POA: Diagnosis not present

## 2019-06-25 DIAGNOSIS — M50322 Other cervical disc degeneration at C5-C6 level: Secondary | ICD-10-CM | POA: Diagnosis not present

## 2019-06-25 DIAGNOSIS — M9905 Segmental and somatic dysfunction of pelvic region: Secondary | ICD-10-CM | POA: Diagnosis not present

## 2019-06-25 DIAGNOSIS — M9901 Segmental and somatic dysfunction of cervical region: Secondary | ICD-10-CM | POA: Diagnosis not present

## 2019-06-25 NOTE — Progress Notes (Signed)
Internal Medicine Clinic Attending  Case discussed with Dr. Bloomfield at the time of the visit.  We reviewed the resident's history and exam and pertinent patient test results.  I agree with the assessment, diagnosis, and plan of care documented in the resident's note.  

## 2019-06-26 ENCOUNTER — Encounter: Payer: Self-pay | Admitting: Internal Medicine

## 2019-06-26 ENCOUNTER — Encounter: Payer: BC Managed Care – PPO | Admitting: Dietician

## 2019-06-26 ENCOUNTER — Ambulatory Visit: Payer: BC Managed Care – PPO | Admitting: Dietician

## 2019-06-26 NOTE — Addendum Note (Signed)
Addended by: Neomia Dear on: 06/26/2019 07:35 PM   Modules accepted: Orders

## 2019-07-03 DIAGNOSIS — M545 Low back pain: Secondary | ICD-10-CM | POA: Diagnosis not present

## 2019-07-03 DIAGNOSIS — M9901 Segmental and somatic dysfunction of cervical region: Secondary | ICD-10-CM | POA: Diagnosis not present

## 2019-07-03 DIAGNOSIS — M50322 Other cervical disc degeneration at C5-C6 level: Secondary | ICD-10-CM | POA: Diagnosis not present

## 2019-07-03 DIAGNOSIS — M9905 Segmental and somatic dysfunction of pelvic region: Secondary | ICD-10-CM | POA: Diagnosis not present

## 2019-07-05 ENCOUNTER — Encounter: Payer: Self-pay | Admitting: Internal Medicine

## 2019-07-10 DIAGNOSIS — M50322 Other cervical disc degeneration at C5-C6 level: Secondary | ICD-10-CM | POA: Diagnosis not present

## 2019-07-10 DIAGNOSIS — M9905 Segmental and somatic dysfunction of pelvic region: Secondary | ICD-10-CM | POA: Diagnosis not present

## 2019-07-10 DIAGNOSIS — M9901 Segmental and somatic dysfunction of cervical region: Secondary | ICD-10-CM | POA: Diagnosis not present

## 2019-07-10 DIAGNOSIS — M545 Low back pain: Secondary | ICD-10-CM | POA: Diagnosis not present

## 2019-07-14 ENCOUNTER — Other Ambulatory Visit: Payer: Self-pay | Admitting: Student in an Organized Health Care Education/Training Program

## 2019-07-14 DIAGNOSIS — E119 Type 2 diabetes mellitus without complications: Secondary | ICD-10-CM

## 2019-07-18 DIAGNOSIS — M545 Low back pain: Secondary | ICD-10-CM | POA: Diagnosis not present

## 2019-07-18 DIAGNOSIS — M9901 Segmental and somatic dysfunction of cervical region: Secondary | ICD-10-CM | POA: Diagnosis not present

## 2019-07-18 DIAGNOSIS — M50322 Other cervical disc degeneration at C5-C6 level: Secondary | ICD-10-CM | POA: Diagnosis not present

## 2019-07-18 DIAGNOSIS — M9905 Segmental and somatic dysfunction of pelvic region: Secondary | ICD-10-CM | POA: Diagnosis not present

## 2019-07-24 DIAGNOSIS — M545 Low back pain: Secondary | ICD-10-CM | POA: Diagnosis not present

## 2019-07-24 DIAGNOSIS — M9901 Segmental and somatic dysfunction of cervical region: Secondary | ICD-10-CM | POA: Diagnosis not present

## 2019-07-24 DIAGNOSIS — M9905 Segmental and somatic dysfunction of pelvic region: Secondary | ICD-10-CM | POA: Diagnosis not present

## 2019-07-24 DIAGNOSIS — M50322 Other cervical disc degeneration at C5-C6 level: Secondary | ICD-10-CM | POA: Diagnosis not present

## 2019-07-31 DIAGNOSIS — M9905 Segmental and somatic dysfunction of pelvic region: Secondary | ICD-10-CM | POA: Diagnosis not present

## 2019-07-31 DIAGNOSIS — M545 Low back pain: Secondary | ICD-10-CM | POA: Diagnosis not present

## 2019-07-31 DIAGNOSIS — M50322 Other cervical disc degeneration at C5-C6 level: Secondary | ICD-10-CM | POA: Diagnosis not present

## 2019-07-31 DIAGNOSIS — M9901 Segmental and somatic dysfunction of cervical region: Secondary | ICD-10-CM | POA: Diagnosis not present

## 2019-08-07 DIAGNOSIS — M50322 Other cervical disc degeneration at C5-C6 level: Secondary | ICD-10-CM | POA: Diagnosis not present

## 2019-08-07 DIAGNOSIS — M9901 Segmental and somatic dysfunction of cervical region: Secondary | ICD-10-CM | POA: Diagnosis not present

## 2019-08-07 DIAGNOSIS — M9905 Segmental and somatic dysfunction of pelvic region: Secondary | ICD-10-CM | POA: Diagnosis not present

## 2019-08-07 DIAGNOSIS — M545 Low back pain: Secondary | ICD-10-CM | POA: Diagnosis not present

## 2019-08-10 DIAGNOSIS — Z20828 Contact with and (suspected) exposure to other viral communicable diseases: Secondary | ICD-10-CM | POA: Diagnosis not present

## 2019-09-18 ENCOUNTER — Other Ambulatory Visit: Payer: Self-pay | Admitting: Internal Medicine

## 2019-09-18 DIAGNOSIS — E119 Type 2 diabetes mellitus without complications: Secondary | ICD-10-CM

## 2019-09-18 NOTE — Telephone Encounter (Signed)
metFORMIN (GLUCOPHAGE) 500 MG tablet, REFILL REQUEST @  [facility] [place] - [address] [phone] (Phone) [phone] (Fax)

## 2019-09-19 ENCOUNTER — Encounter: Payer: Self-pay | Admitting: Internal Medicine

## 2019-09-19 ENCOUNTER — Ambulatory Visit (INDEPENDENT_AMBULATORY_CARE_PROVIDER_SITE_OTHER): Payer: BC Managed Care – PPO | Admitting: Internal Medicine

## 2019-09-19 VITALS — BP 127/75 | HR 95 | Wt 301.5 lb

## 2019-09-19 DIAGNOSIS — A630 Anogenital (venereal) warts: Secondary | ICD-10-CM

## 2019-09-19 NOTE — Patient Instructions (Addendum)
Sheena Soto, It was a pleasure seeing you. 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 We are referring you to dermatology to discuss removal options.   Take care, and let me know if you have any other questions or concerns.   Dr. Chesley Mires  Genital Warts Genital warts are a common STD (sexually transmitted disease). They may appear as small bumps on the skin of the genital and anal areas. They sometimes become irritated and cause pain. Genital warts are easily passed to other people through sexual contact. Many people do not know that they are infected. They may be infected for years without symptoms. However, even if they do not have symptoms, they can pass the infection to their sexual partners. Getting treatment is important because genital warts can lead to other problems. In females, the virus that causes genital warts may increase the risk for cervical cancer. What are the causes? This condition is caused by a virus that is called human papillomavirus (HPV). HPV is spread by having unprotected sex with an infected person. It can be spread through vaginal, anal, and oral sex. What increases the risk? You are more likely to develop this condition if:  You have unprotected sex.  You have multiple sexual partners.  You are sexually active before age 60.  You are a man who isnot circumcised.  You have a female sexual partner who is not circumcised.  You have a weakened body defense system (immune system) due to disease or medicine.  You smoke. What are the signs or symptoms? Symptoms of this condition include:  Small growths in the genital area or anal area. These warts often grow in clusters.  Itching and irritation in the genital area or anal area.  Bleeding from the warts.  Pain during sex. How is this diagnosed? This condition is diagnosed based on:  Your symptoms.  A physical  exam. You may also have other tests, including:  Biopsy. A tissue sample is removed so it can be checked under a microscope.  Colposcopy. In females, a magnifying tool is used to examine the vagina and cervix. Certain solutions may be used to make the HPV cells change color so they can be seen more easily.  A Pap test in females.  Tests for other STDs. How is this treated? This condition may be treated with:  Medicines, such as: ? Solutions or creams applied to your skin (topical).  Procedures, such as: ? Freezing the warts with liquid nitrogen (cryotherapy). ? Burning the warts with a laser or electric probe (electrocautery). ? Surgery to remove the warts. Follow these instructions at home: Medicines   Apply over-the-counter and prescription medicines only as told by your health care provider.  Do not treat genital warts with medicines that are used for treating hand warts.  Talk with your health care provider about using over-the-counter anti-itch creams. Instructions for women  Get screened regularly for cervical cancer. Women who have genital warts are at an increased risk for this cancer. This type of cancer is slow growing and can be cured if it is found early.  If you become pregnant, tell your health care provider that you have had an HPV infection. Your health care provider will monitor you closely during pregnancy to be sure that your baby is safe. General instructions  Do not touch or scratch the warts.  Do not have sex until your treatment has been completed.  Tell your current and past sexual partners about your condition because they may also need treatment.  After treatment, use condoms during sex to prevent future infections.  Keep all follow-up visits as told by your health care provider. This is important. How is this prevented? Talk with your health care provider about getting the HPV vaccine. The vaccine:  Can prevent some HPV infections and  cancers.  Is recommended for males and females who are 52-48 years old.  Is not recommended for pregnant women.  Will not work if you already have HPV. Contact a health care provider if you:  Have redness, swelling, or pain in the area of the treated skin.  Have a fever.  Feel generally ill.  Feel lumps in and around your genital or anal area.  Have bleeding in your genital or anal area.  Have pain during sex. Summary  Genital warts are a common STD (sexually transmitted disease). It may appear as small bumps on the genital and anal areas.  This condition is caused by a virus that is called human papillomavirus (HPV). HPV is spread by having unprotected sex with an infected person. It can be spread through vaginal, anal, and oral sex.  Treatment is important because genital warts can lead to other problems. In females, the virus that causes genital warts may increase the risk for cervical cancer.  This condition may be treated with medicine that is applied to the skin, or procedures to remove the warts.  The HPV vaccine can prevent some HPV infections and cancers. It is recommended that the vaccine be given to males and females who are 35-35 years old. This information is not intended to replace advice given to you by your health care provider. Make sure you discuss any questions you have with your health care provider. Document Revised: 07/05/2017 Document Reviewed: 07/05/2017 Elsevier Patient Education  2020 [employer].

## 2019-09-20 ENCOUNTER — Encounter: Payer: Self-pay | Admitting: Internal Medicine

## 2019-09-20 DIAGNOSIS — A630 Anogenital (venereal) warts: Secondary | ICD-10-CM | POA: Insufficient documentation

## 2019-09-20 NOTE — Assessment & Plan Note (Signed)
Patient presents for bump in vaginal area that she first noticed about 4 months ago. It has progressively become more bothersome every times she wipes after using the bathroom. It is otherwise not painful. No bleeding or discharge. On exam, she had a second lesions just outside her labia majora that she states has been there for several years. 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  Will refer her to dermatology for further treatment options. Would also benefit from HPV vaccine.

## 2019-09-20 NOTE — Progress Notes (Signed)
Established Patient Office Visit  Subjective:  Patient ID: Sheena Soto, female    DOB: [birth year]  Age: 44 y.o. MRN: [id number]  CC:  Chief Complaint  Patient presents with  . vaginal problem    HPI Sheena Soto presents for vulvar bump that she noticed 4-5 months ago. Please see problem based charting for further details.   Past Medical History:  Diagnosis Date  . Diabetes mellitus type II, non insulin dependent (HCC) 03/27/2018    Past Surgical History:  Procedure Laterality Date  . BREAST BIOPSY Left   . BREAST EXCISIONAL BIOPSY Left    Pt stated she had mass removed, doctor not sure what it was  . BREAST SURGERY    . CESAREAN SECTION    . HERNIA REPAIR      Family History  Problem Relation Age of Onset  . Diabetes Mother   . Hypertension Mother   . Atrial fibrillation Mother   . Diabetes Maternal Grandmother   . Hypertension Maternal Grandmother   . Heart attack Maternal Uncle 79    Social History   Socioeconomic History  . Marital status: Single    Spouse name: Not on file  . Number of children: Not on file  . Years of education: Not on file  . Highest education level: Not on file  Occupational History  . Not on file  Tobacco Use  . Smoking status: Never Smoker  . Smokeless tobacco: Never Used  Substance and Sexual Activity  . Alcohol use: Yes    Comment: occassionally  . Drug use: Never  . Sexual activity: Yes    Birth control/protection: None  Other Topics Concern  . Not on file  Social History Narrative   Currently moved to [place] 6 months ago   Lives with daughter   Social Determinants of Health   Financial Resource Strain:   . Difficulty of Paying Living Expenses:   Food Insecurity:   . Worried About Programme researcher, broadcasting/film/video in the Last Year:   . Barista in the Last Year:   Transportation Needs:   . Freight forwarder (Medical):   Marland Kitchen Lack of Transportation (Non-Medical):   Physical Activity:   . Days of  Exercise per Week:   . Minutes of Exercise per Session:   Stress:   . Feeling of Stress :   Social Connections:   . Frequency of Communication with Friends and Family:   . Frequency of Social Gatherings with Friends and Family:   . Attends Religious Services:   . Active Member of Clubs or Organizations:   . Attends Banker Meetings:   Marland Kitchen Marital Status:   Intimate Partner Violence:   . Fear of Current or Ex-Partner:   . Emotionally Abused:   Marland Kitchen Physically Abused:   . Sexually Abused:     Outpatient Medications Prior to Visit  Medication Sig Dispense Refill  . glucose blood (ONE TOUCH ULTRA TEST) test strip Use as instructed (Patient not taking: Reported on 01/15/2019) 100 each 12  . Lancets (ACCU-CHEK SAFE-T PRO) lancets Use as instructed (Patient not taking: Reported on 01/15/2019) 100 each 12  . metFORMIN (GLUCOPHAGE) 500 MG tablet TAKE 1 TABLET BY MOUTH TWICE DAILY WITH A MEAL 180 tablet 1  . nystatin cream (MYCOSTATIN) Apply 1 application topically 2 (two) times daily. 30 g 1  . omeprazole (PRILOSEC) 20 MG capsule Take 1 capsule (20 mg total) by mouth daily. Take 30 minutes before your first meal of  the day. (Patient not taking: Reported on 01/15/2019) 30 capsule 2  . Prenatal Vit-Fe Fumarate-FA (PREPLUS) 27-1 MG TABS Take 1 tablet by mouth daily. 30 tablet 13   No facility-administered medications prior to visit.    Allergies  Allergen Reactions  . Naprosyn [Naproxen] Other (See Comments)    Flared up diverticulitis    ROS Review of Systems  Constitutional: Negative for chills, fatigue and fever.  Eyes: Negative for visual disturbance.  Respiratory: Negative for cough and shortness of breath.   Cardiovascular: Negative for chest pain and palpitations.  Gastrointestinal: Negative for abdominal pain, nausea and vomiting.  Genitourinary: Negative for dysuria, flank pain, hematuria, menstrual problem, pelvic pain, vaginal discharge and vaginal pain.  Musculoskeletal:  Negative for arthralgias.  Skin: Negative for rash.  Neurological: Negative for headaches.  Psychiatric/Behavioral: Negative for dysphoric mood. The patient is not nervous/anxious.       Objective:    Physical Exam  Constitutional: She is oriented to person, place, and time. She appears well-developed and well-nourished. No distress.  HENT:  Head: Normocephalic and atraumatic.  Eyes: Conjunctivae are normal.  Pulmonary/Chest: Effort normal.  Abdominal: Soft. She exhibits no distension. There is no abdominal tenderness.  Genitourinary:     Musculoskeletal:        General: Normal range of motion.     Cervical back: Neck supple.  Neurological: She is alert and oriented to person, place, and time.  Psychiatric: She has a normal mood and affect.    BP 127/75 (BP Location: Left Arm, Patient Position: Sitting, Cuff Size: Normal)   Pulse 95   Wt (!) 301 lb 8 oz (136.8 kg)   SpO2 99%   BMI 50.17 kg/m  Wt Readings from Last 3 Encounters:  09/19/19 (!) 301 lb 8 oz (136.8 kg)  06/18/19 (!) 304 lb 1.6 oz (137.9 kg)  01/29/19 297 lb (134.7 kg)     Health Maintenance Due  Topic Date Due  . HEMOGLOBIN A1C  09/16/2019    There are no preventive care reminders to display for this patient.  Lab Results  Component Value Date   TSH 4.164 06/30/2018   Lab Results  Component Value Date   WBC 10.7 (H) 06/30/2018   HGB 13.2 06/30/2018   HCT 40.8 06/30/2018   MCV 87.4 06/30/2018   PLT 421 (H) 06/30/2018   Lab Results  Component Value Date   NA 140 06/30/2018   K 3.6 06/30/2018   CO2 27 06/30/2018   GLUCOSE 110 (H) 06/30/2018   BUN 10 06/30/2018   CREATININE 0.89 12/22/2018   CALCIUM 9.2 06/30/2018   ANIONGAP 11 06/30/2018   Lab Results  Component Value Date   CHOL 143 02/22/2018   Lab Results  Component Value Date   HDL 34 (L) 02/22/2018   Lab Results  Component Value Date   LDLCALC 82 02/22/2018   Lab Results  Component Value Date   TRIG 134 02/22/2018    Lab Results  Component Value Date   CHOLHDL 4.2 02/22/2018   Lab Results  Component Value Date   HGBA1C 6.6 (A) 06/18/2019      Assessment & Plan:   Problem List Items Addressed This Visit      Musculoskeletal and Integument   Genital warts - Primary    Patient presents for bump in vaginal area that she first noticed about 4 months ago. It has progressively become more bothersome every times she wipes after using the bathroom. It is otherwise not painful. No bleeding  or discharge. On exam, she had a second lesions just outside her labia majora that she states has been there for several years. 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  Will refer her to dermatology for further treatment options. Would also benefit from HPV vaccine.       Relevant Orders   Ambulatory referral to Dermatology      No orders of the defined types were placed in this encounter.   Follow-up: Return in about 3 months (around 12/19/2019) for DM.    Bridget Hartshorn, DO

## 2019-09-27 NOTE — Progress Notes (Signed)
Internal Medicine Clinic Attending  I saw and evaluated the patient.  I personally confirmed the key portions of the history and exam documented by Dr. Bloomfield and I reviewed pertinent patient test results.  The assessment, diagnosis, and plan were formulated together and I agree with the documentation in the resident's note.  

## 2019-11-17 DIAGNOSIS — Z03818 Encounter for observation for suspected exposure to other biological agents ruled out: Secondary | ICD-10-CM | POA: Diagnosis not present

## 2019-11-17 DIAGNOSIS — Z20822 Contact with and (suspected) exposure to covid-19: Secondary | ICD-10-CM | POA: Diagnosis not present

## 2019-11-27 ENCOUNTER — Emergency Department (HOSPITAL_COMMUNITY): Payer: BC Managed Care – PPO

## 2019-11-27 ENCOUNTER — Other Ambulatory Visit: Payer: Self-pay

## 2019-11-27 ENCOUNTER — Emergency Department (HOSPITAL_COMMUNITY)
Admission: EM | Admit: 2019-11-27 | Discharge: 2019-11-27 | Disposition: A | Discharge status: Home / Self Care | Payer: BC Managed Care – PPO | Attending: Emergency Medicine | Admitting: Emergency Medicine

## 2019-11-27 ENCOUNTER — Encounter (HOSPITAL_COMMUNITY): Payer: Self-pay | Admitting: Emergency Medicine

## 2019-11-27 DIAGNOSIS — R0902 Hypoxemia: Secondary | ICD-10-CM | POA: Diagnosis not present

## 2019-11-27 DIAGNOSIS — J984 Other disorders of lung: Secondary | ICD-10-CM | POA: Diagnosis not present

## 2019-11-27 DIAGNOSIS — Z7984 Long term (current) use of oral hypoglycemic drugs: Secondary | ICD-10-CM | POA: Insufficient documentation

## 2019-11-27 DIAGNOSIS — R0789 Other chest pain: Secondary | ICD-10-CM | POA: Diagnosis not present

## 2019-11-27 DIAGNOSIS — R079 Chest pain, unspecified: Secondary | ICD-10-CM | POA: Diagnosis not present

## 2019-11-27 DIAGNOSIS — M5489 Other dorsalgia: Secondary | ICD-10-CM | POA: Diagnosis not present

## 2019-11-27 DIAGNOSIS — E119 Type 2 diabetes mellitus without complications: Secondary | ICD-10-CM | POA: Diagnosis not present

## 2019-11-27 DIAGNOSIS — M546 Pain in thoracic spine: Secondary | ICD-10-CM | POA: Diagnosis not present

## 2019-11-27 LAB — CBC
HCT: 39.5 % (ref 36.0–46.0)
Hemoglobin: 13 g/dL (ref 12.0–15.0)
MCH: 28.1 pg (ref 26.0–34.0)
MCHC: 32.9 g/dL (ref 30.0–36.0)
MCV: 85.5 fL (ref 80.0–100.0)
Platelets: 358 10*3/uL (ref 150–400)
RBC: 4.62 MIL/uL (ref 3.87–5.11)
RDW: 14.6 % (ref 11.5–15.5)
WBC: 7.2 10*3/uL (ref 4.0–10.5)
nRBC: 0 % (ref 0.0–0.2)

## 2019-11-27 LAB — BASIC METABOLIC PANEL
Anion gap: 7 (ref 5–15)
BUN: 7 mg/dL (ref 6–20)
CO2: 26 mmol/L (ref 22–32)
Calcium: 9.1 mg/dL (ref 8.9–10.3)
Chloride: 104 mmol/L (ref 98–111)
Creatinine, Ser: 0.85 mg/dL (ref 0.44–1.00)
GFR calc Af Amer: >60 mL/min (ref >60)
GFR calc non Af Amer: >60 mL/min (ref >60)
Glucose, Bld: 164 mg/dL — ABNORMAL HIGH (ref 70–99)
Potassium: 3.9 mmol/L (ref 3.5–5.1)
Sodium: 137 mmol/L (ref 135–145)

## 2019-11-27 LAB — TROPONIN I (HIGH SENSITIVITY): Troponin I (High Sensitivity): 3 ng/L (ref <18)

## 2019-11-27 LAB — I-STAT BETA HCG BLOOD, ED (MC, WL, AP ONLY): I-stat hCG, quantitative: <5 m[IU]/mL (ref <5)

## 2019-11-27 MED ORDER — SODIUM CHLORIDE 0.9% FLUSH: 3.0000 mL | Freq: Once | INTRAVENOUS | Status: DC

## 2019-11-27 MED ORDER — METHOCARBAMOL 500 MG PO TABS: 1000.0000 mg | ORAL_TABLET | Freq: Three times a day (TID) | ORAL | 0 refills | Status: AC

## 2019-11-27 NOTE — ED Triage Notes (Signed)
EMS stated, pt. Reaching with her left arm and had pain in chest and back.

## 2019-11-27 NOTE — ED Triage Notes (Signed)
Pt. Stated, when I burped felt better.

## 2019-11-27 NOTE — Discharge Instructions (Addendum)
You were evaluated in the emergency department for chest pain.  Based on your descriptions, work up I think this may have been related to a muscular issue possibly gas from last night.  Consider taking daily acid reducer. Muscle relaxer was prescribed for you to use as needed for spasms, tightness.  Alternate tylenol every 6-8 hours for any pain, heat, massage, rest.   Based on your risk factors, work up and exam you are considered low risk for major adverse cardiac events in the next 30 days.  This means you can be discharged with close follow up with follow up with primary care doctor or cardiology for further outpatient work up as needed, if your chest discomfort continues  Call your primary care doctor or cardiologist as soon as possible to establish care and further discussion and work up of your symptoms on an outpatient setting  Please return to ED if: Your chest pain is worse or on exertion You have a cough that gets worse, or you cough up blood. You have severe pain in chest, back or abdomen. You have chest pain with loss of sensation, weakness or tingling in extremities You have chest pain or shortness of breath with exertion or activity You have sudden, unexplained discomfort in your chest, with radiation arms, back, neck, or jaw. You suddenly have chest pain and begin to sweat, or your skin gets clammy. You feel chest pain with nausea or vomiting, blood in vomit You suddenly feel light-headed or faint. Your heart begins to beat quickly, or it feels like it is skipping beats. You have one sided leg swelling or calf pain You have chest pain with fever, chills, cough or other viral symptoms

## 2019-11-27 NOTE — ED Provider Notes (Signed)
[facility] EMERGENCY DEPARTMENT Provider Note   CSN: [id number] Arrival date & time: 11/27/19  6761     History Chief Complaint  Patient presents with  . Back Pain  . Chest Pain   Sheena Soto is a 44 y.o. female with past medical history of prediabetes on Metformin, palpitations, nonsustained VT, obesity, GERD presents to the ER for evaluation of left thoracic back pain and chest pain that began suddenly at 7 AM.  Patient states she was getting ready to exercise and moved her exercise machine to her office.  She made a sudden move downwards and to the left with her arm and felt sudden sharp pain in her left shoulder blade that radiated to the left upper chest/shoulder.  It felt like "a push" and also like there was gas trapped into her chest that was moving upwards.  The pain was initially constant and worse with moving the arm above her head, taking deep breaths and position changes.  She called the ambulance and was given 4 aspirin.  States the pain is gone but now feels like "she did something" in that area.  Is now able to lift her arm up with minimal discomfort.  Patient feels like it is some type of muscular issue.  Reports having chronic neck issues from an injury several years ago.  She goes to chiropractor weekly where she gets her neck and back aligned but last week she missed her appointment and has felt more tense in her back and neck since.  She had some tingling in her left fingertips when the paramedics got there and she attributes this to being anxious because paramedic told her that she had signs of a heart attack.  Was told her blood pressure was high by the paramedics but states they had to change her cuff to a bigger one.  States last night she was really hungry and had 3 bags of chips and fell asleep.  Feels like she has been gassy since.  Has burped a few times while being here and now feels better as well. Patient has an inspection at 1 pm and  chiropractor appointment at 2 pm today, would like to make these appointments.   She had no associated palpitations, lightheadedness, diaphoresis, nausea, vomiting, abdominal pain, syncope.  No tobacco use.  No illicit drug use.  Has had issues of acid reflux in the past does not take any medicines.  No personal history of hypertension, hyperlipidemia, CAD.  Her mother had A. fib but denies any family history of early onset CAD. HPI     Past Medical History:  Diagnosis Date  . Diabetes mellitus type II, non insulin dependent (HCC) 03/27/2018    Patient Active Problem List   Diagnosis Date Noted  . Genital warts 09/20/2019  . Primary osteoarthritis of both knees 01/01/2019  . Cervical cancer screening 01/01/2019  . Healthcare maintenance 01/01/2019  . Folliculitis of axilla 01/01/2019  . Ventricular tachycardia (HCC) 12/25/2018  . Anxiety 07/07/2018  . Gastroesophageal reflux disease 07/07/2018  . Palpitations 07/07/2018  . Obstructive sleep apnea 07/07/2018  . Diabetes mellitus type II, non insulin dependent (HCC) 03/27/2018    Past Surgical History:  Procedure Laterality Date  . BREAST BIOPSY Left   . BREAST EXCISIONAL BIOPSY Left    Pt stated she had mass removed, doctor not sure what it was  . BREAST SURGERY    . CESAREAN SECTION    . HERNIA REPAIR  OB History    Gravida  3   Para  1   Term  1   Preterm      AB  2   Living  1     SAB  1   TAB  1   Ectopic      Multiple      Live Births  1           Family History  Problem Relation Age of Onset  . Diabetes Mother   . Hypertension Mother   . Atrial fibrillation Mother   . Diabetes Maternal Grandmother   . Hypertension Maternal Grandmother   . Heart attack Maternal Uncle 71    Social History   Tobacco Use  . Smoking status: Never Smoker  . Smokeless tobacco: Never Used  Vaping Use  . Vaping Use: Never used  Substance Use Topics  . Alcohol use: Yes    Comment: occassionally  .  Drug use: Never    Home Medications Prior to Admission medications   Medication Sig Start Date End Date Taking? Authorizing Provider  glucose blood (ONE TOUCH ULTRA TEST) test strip Use as instructed Patient not taking: Reported on 01/15/2019 07/07/18   Nyra Market, MD  Lancets (ACCU-CHEK SAFE-T PRO) lancets Use as instructed Patient not taking: Reported on 01/15/2019 07/13/18   Lenward Chancellor D, DO  metFORMIN (GLUCOPHAGE) 500 MG tablet TAKE 1 TABLET BY MOUTH TWICE DAILY WITH A MEAL 09/18/19   Bloomfield, Carley D, DO  methocarbamol (ROBAXIN) 500 MG tablet Take 2 tablets (1,000 mg total) by mouth 3 (three) times daily for 5 days. 11/27/19 12/02/19  Liberty Handy, PA-C  nystatin cream (MYCOSTATIN) Apply 1 application topically 2 (two) times daily. 01/29/19   Constant, Peggy, MD  Prenatal Vit-Fe Fumarate-FA (PREPLUS) 27-1 MG TABS Take 1 tablet by mouth daily. 01/29/19   Constant, Peggy, MD    Allergies    Naprosyn [naproxen]  Review of Systems   Review of Systems  Cardiovascular: Positive for chest pain.  Musculoskeletal: Positive for back pain.  Neurological:       Fingertip paresthesias   All other systems reviewed and are negative.   Physical Exam Updated Vital Signs BP (!) 147/92   Pulse 72   Temp 98.3 F (36.8 C)   Resp (!) 23   Ht 5\' 5"  (1.651 m)   Wt 135.2 kg   LMP 11/12/2019   SpO2 98%   BMI 49.59 kg/m   Physical Exam Constitutional:      Appearance: She is well-developed.     Comments: NAD. Non toxic.   HENT:     Head: Normocephalic and atraumatic.     Nose: Nose normal.  Eyes:     General: Lids are normal.     Conjunctiva/sclera: Conjunctivae normal.  Neck:     Trachea: Trachea normal.     Comments: Trachea midline.  Cardiovascular:     Rate and Rhythm: Normal rate and regular rhythm.     Pulses:          Radial pulses are 1+ on the right side and 1+ on the left side.       Dorsalis pedis pulses are 1+ on the right side and 1+ on the left side.      Heart sounds: Normal heart sounds, S1 normal and S2 normal.     Comments: No murmurs. No LE edema or calf tenderness.  Pulmonary:     Effort: Pulmonary effort is normal.  Breath sounds: Normal breath sounds.  Abdominal:     General: Bowel sounds are normal.     Palpations: Abdomen is soft.     Tenderness: There is no abdominal tenderness.     Comments: No epigastric or upper abdominal tenderness.  Musculoskeletal:     Cervical back: Normal range of motion.     Comments: No tenderness in paraspinal or midline CT spine but patient states it feels "tight".  Some "soreness" in left thoracic and upper chest reported with active ROM of left arm   Skin:    General: Skin is warm and dry.     Capillary Refill: Capillary refill takes less than 2 seconds.     Comments: No rash to chest wall  Neurological:     Mental Status: She is alert.     GCS: GCS eye subscore is 4. GCS verbal subscore is 5. GCS motor subscore is 6.     Comments: Sensation and strength intact in upper/lower extremities  Psychiatric:        Speech: Speech normal.        Behavior: Behavior normal.        Thought Content: Thought content normal.     ED Results / Procedures / Treatments   Labs (all labs ordered are listed, but only abnormal results are displayed) Labs Reviewed  BASIC METABOLIC PANEL - Abnormal; Notable for the following components:      Result Value   Glucose, Bld 164 (*)    All other components within normal limits  CBC  I-STAT BETA HCG BLOOD, ED ([facility], [facility], [facility] ONLY)  TROPONIN I (HIGH SENSITIVITY)  TROPONIN I (HIGH SENSITIVITY)    EKG EKG Interpretation  Date/Time:  Tuesday November 27 2019 12:23:30 EDT Ventricular Rate:  70 PR Interval:    QRS Duration: 95 QT Interval:  438 QTC Calculation: 473 R Axis:   10 Text Interpretation: Sinus rhythm Low voltage, precordial leads Borderline T abnormalities, diffuse leads No significant change since last tracing Abnormal ECG Confirmed by Carmin Muskrat  [phone]) on 11/27/2019 12:42:44 PM   Radiology DG Chest 2 View  Result Date: 11/27/2019 CLINICAL DATA:  Left-sided chest pain. EXAM: CHEST - 2 VIEW COMPARISON:  Chest x-ray dated June 30, 2018. FINDINGS: The heart size and mediastinal contours are within normal limits. Both lungs are clear. The visualized skeletal structures are unremarkable. IMPRESSION: No active cardiopulmonary disease. Electronically Signed   By: Titus Dubin M.D.   On: 11/27/2019 10:08    Procedures Procedures (including critical care time)  Medications Ordered in ED Medications  sodium chloride flush (NS) 0.9 % injection 3 mL (has no administration in time range)    ED Course  I have reviewed the triage vital signs and the nursing notes.  Pertinent labs & imaging results that were available during my care of the patient were reviewed by me and considered in my medical decision making (see chart for details).    MDM Rules/Calculators/A&P                            This patient complains of left trapezius pain radiating to left chest while making sudden movement down/left.    I obtained additional history from triage and nursing notes  Previous medical records available, nursing notes reviewed to obtain more history and assist with MDM. She had echocardiogram March 2020 that was normal. Holter monitor in March 2020 shows NS VT that was attributed to increase  caffeine.  Seen by cardiology since.   Highest on differential diagnosis is atypical chest pain likely muscular given her history of onset during sudden movement, reproducibility with left arm movement, palpation, history of chronic neck issues, recent missed chiro appointment.  Patient agrees, states it feels "very  Muscular".    ER work up initiated in triage.  I ordered repeat EKG to evaluate for any ischemic changes since the first one.   I ordered, reviewed and personally visualized and interpreted the above labs and/or imaging studies.  ER work up  reveals non specific diffuse TW flattening/inversion. These do not appear to be new.  Troponin undetectable. CXR non acute.    Patient re-evaluated and no longer having CP.  Her sudden onset of pain was a little less than 3 hours ago.  Per protocol repeat troponin necessary but patient declined as she has upcoming appointments she did not want to miss.   Overall chest pain is atypical.  Likely muscular.  She has underlying GERD and has been feeling gassy since last night.  GI issue could be contributing.  No associated concerningfeatures such as fever, cough, SOB. No recent illnesses. No palpitations, light-headedness, syncope, pleuritic pain, leg swelling/calf pain to suggest DVT.  Cardiac risk factors include elevated BMI, prediabetes only. She is active/exercises for gastric sleeve surgery and denies recent exertional symptoms. She has rproducible CP with position changes.  No LE edema or calf tenderness. No epigastric tenderness. No neuro or pulse deficits. She has no risk factors for PE.   Given symptomatology, exam, non ischemic cardiac work up in ER and HEART score = 3, patient is appropriate for discharge with PCP/cardiology f/u.  Work up not suggestive of symptomatic anemia, PE, PTX, dissection, ACS.  Likely atypical chest pain, possibly MSK etiology vs GI related vs other. ED return preacutions given. Pt appears reliable for follow up, aware of symptoms that would warrant return to ER.  Pt is comfortable and agreeable with ER POC and discharge plan.   Final Clinical Impression(s) / ED Diagnoses Final diagnoses:  Atypical chest pain    Rx / DC Orders ED Discharge Orders         Ordered    methocarbamol (ROBAXIN) 500 MG tablet  3 times daily     Discontinue  Reprint     11/27/19 1252           Liberty Handy, PA-C 11/27/19 1253    Gerhard Munch, MD 11/28/19 1606

## 2019-11-27 NOTE — ED Notes (Signed)
Patient Alert and oriented to baseline. Stable and ambulatory to baseline. Patient verbalized understanding of the discharge instructions.  Patient belongings were taken by the patient.   

## 2019-12-10 NOTE — Addendum Note (Signed)
Addended by: Neomia Dear on: 12/10/2019 01:02 PM   Modules accepted: Orders

## 2019-12-19 ENCOUNTER — Ambulatory Visit (INDEPENDENT_AMBULATORY_CARE_PROVIDER_SITE_OTHER): Payer: BC Managed Care – PPO | Admitting: Internal Medicine

## 2019-12-19 ENCOUNTER — Encounter: Payer: Self-pay | Admitting: Internal Medicine

## 2019-12-19 ENCOUNTER — Other Ambulatory Visit: Payer: Self-pay

## 2019-12-19 VITALS — BP 116/66 | HR 88 | Temp 98.3°F | Ht 65.75 in | Wt 279.5 lb

## 2019-12-19 DIAGNOSIS — E119 Type 2 diabetes mellitus without complications: Secondary | ICD-10-CM | POA: Diagnosis not present

## 2019-12-19 DIAGNOSIS — M170 Bilateral primary osteoarthritis of knee: Secondary | ICD-10-CM | POA: Diagnosis not present

## 2019-12-19 DIAGNOSIS — A630 Anogenital (venereal) warts: Secondary | ICD-10-CM

## 2019-12-19 DIAGNOSIS — Z13220 Encounter for screening for lipoid disorders: Secondary | ICD-10-CM | POA: Diagnosis not present

## 2019-12-19 LAB — POCT GLYCOSYLATED HEMOGLOBIN (HGB A1C): Hemoglobin A1C: 7.2 % — AB (ref 4.0–5.6)

## 2019-12-19 LAB — GLUCOSE, CAPILLARY: Glucose-Capillary: 104 mg/dL — ABNORMAL HIGH (ref 70–99)

## 2019-12-19 NOTE — Progress Notes (Signed)
Established Patient Office Visit  Subjective:  Patient ID: Sheena Soto, female    DOB: 1976-05-20  Age: 44 y.o. MRN: [id number]  CC:  Chief Complaint  Patient presents with  . Follow-up    Intentional weight loss    HPI Sheena Soto presents for follow-up on type II DM. Please see problem based charting for further details.   Past Medical History:  Diagnosis Date  . Diabetes mellitus type II, non insulin dependent (HCC) 03/27/2018    Past Surgical History:  Procedure Laterality Date  . BREAST BIOPSY Left   . BREAST EXCISIONAL BIOPSY Left    Pt stated she had mass removed, doctor not sure what it was  . BREAST SURGERY    . CESAREAN SECTION    . HERNIA REPAIR      Family History  Problem Relation Age of Onset  . Diabetes Mother   . Hypertension Mother   . Atrial fibrillation Mother   . Diabetes Maternal Grandmother   . Hypertension Maternal Grandmother   . Heart attack Maternal Uncle 5    Social History   Socioeconomic History  . Marital status: Single    Spouse name: Not on file  . Number of children: Not on file  . Years of education: Not on file  . Highest education level: Not on file  Occupational History  . Not on file  Tobacco Use  . Smoking status: Never Smoker  . Smokeless tobacco: Never Used  Vaping Use  . Vaping Use: Never used  Substance and Sexual Activity  . Alcohol use: Yes    Comment: occassionally  . Drug use: Never  . Sexual activity: Yes    Birth control/protection: None  Other Topics Concern  . Not on file  Social History Narrative   Currently moved to [place] 6 months ago   Lives with daughter   Social Determinants of Health   Financial Resource Strain:   . Difficulty of Paying Living Expenses:   Food Insecurity:   . Worried About Programme researcher, broadcasting/film/video in the Last Year:   . Barista in the Last Year:   Transportation Needs:   . Freight forwarder (Medical):   Marland Kitchen Lack of Transportation (Non-Medical):    Physical Activity:   . Days of Exercise per Week:   . Minutes of Exercise per Session:   Stress:   . Feeling of Stress :   Social Connections:   . Frequency of Communication with Friends and Family:   . Frequency of Social Gatherings with Friends and Family:   . Attends Religious Services:   . Active Member of Clubs or Organizations:   . Attends Banker Meetings:   Marland Kitchen Marital Status:   Intimate Partner Violence:   . Fear of Current or Ex-Partner:   . Emotionally Abused:   Marland Kitchen Physically Abused:   . Sexually Abused:     Outpatient Medications Prior to Visit  Medication Sig Dispense Refill  . glucose blood (ONE TOUCH ULTRA TEST) test strip Use as instructed (Patient not taking: Reported on 01/15/2019) 100 each 12  . Lancets (ACCU-CHEK SAFE-T PRO) lancets Use as instructed (Patient not taking: Reported on 01/15/2019) 100 each 12  . metFORMIN (GLUCOPHAGE) 500 MG tablet TAKE 1 TABLET BY MOUTH TWICE DAILY WITH A MEAL 180 tablet 1  . nystatin cream (MYCOSTATIN) Apply 1 application topically 2 (two) times daily. 30 g 1  . Prenatal Vit-Fe Fumarate-FA (PREPLUS) 27-1 MG TABS Take 1 tablet by  mouth daily. 30 tablet 13   No facility-administered medications prior to visit.    Allergies  Allergen Reactions  . Naprosyn [Naproxen] Other (See Comments)    Flared up diverticulitis    ROS Review of Systems  Constitutional: Negative for activity change, chills, fatigue and fever.  HENT: Negative for congestion and sinus pain.   Eyes: Negative for visual disturbance.  Respiratory: Negative for cough and shortness of breath.   Cardiovascular: Negative for chest pain.  Gastrointestinal: Negative for abdominal pain, constipation, diarrhea and nausea.  Endocrine: Negative for polydipsia, polyphagia and polyuria.  Genitourinary: Negative for difficulty urinating.  Musculoskeletal: Negative for arthralgias.  Neurological: Negative for dizziness and light-headedness.      Objective:     Physical Exam Constitutional:      General: She is not in acute distress.    Appearance: Normal appearance.  Eyes:     Conjunctiva/sclera: Conjunctivae normal.  Cardiovascular:     Rate and Rhythm: Normal rate and regular rhythm.  Pulmonary:     Effort: Pulmonary effort is normal.     Breath sounds: Normal breath sounds.  Abdominal:     General: There is no distension.     Palpations: Abdomen is soft.     Tenderness: There is no abdominal tenderness.  Musculoskeletal:        General: Normal range of motion.     Cervical back: Neck supple.     Right lower leg: No edema.     Left lower leg: No edema.  Lymphadenopathy:     Cervical: No cervical adenopathy.  Neurological:     General: No focal deficit present.     Mental Status: She is alert and oriented to person, place, and time.  Psychiatric:        Mood and Affect: Mood normal.        Behavior: Behavior normal.     BP 116/66 (BP Location: Left Arm, Patient Position: Sitting, Cuff Size: Large)   Pulse 88   Temp 98.3 F (36.8 C) (Oral)   Ht 5' 5.75" (1.67 m)   Wt 279 lb 8 oz (126.8 kg)   SpO2 99%   BMI 45.46 kg/m  Wt Readings from Last 3 Encounters:  12/19/19 279 lb 8 oz (126.8 kg)  11/27/19 298 lb (135.2 kg)  09/19/19 (!) 301 lb 8 oz (136.8 kg)     Health Maintenance Due  Topic Date Due  . COVID-19 Vaccine (1) Never done  . URINE MICROALBUMIN  01/01/2020    There are no preventive care reminders to display for this patient.  Lab Results  Component Value Date   TSH 4.164 06/30/2018   Lab Results  Component Value Date   WBC 7.2 11/27/2019   HGB 13.0 11/27/2019   HCT 39.5 11/27/2019   MCV 85.5 11/27/2019   PLT 358 11/27/2019   Lab Results  Component Value Date   NA 136 12/19/2019   K 4.1 12/19/2019   CO2 18 (L) 12/19/2019   GLUCOSE 101 (H) 12/19/2019   BUN 3 (L) 12/19/2019   CREATININE 0.83 12/19/2019   BILITOT 0.4 12/19/2019   ALKPHOS 84 12/19/2019   AST 25 12/19/2019   ALT 21 12/19/2019    PROT 7.8 12/19/2019   ALBUMIN 4.0 12/19/2019   CALCIUM 9.7 12/19/2019   ANIONGAP 7 11/27/2019   Lab Results  Component Value Date   CHOL 140 12/19/2019   Lab Results  Component Value Date   HDL 32 (L) 12/19/2019   Lab Results  Component Value Date   LDLCALC 88 12/19/2019   Lab Results  Component Value Date   TRIG 109 12/19/2019   Lab Results  Component Value Date   CHOLHDL 4.4 12/19/2019   Lab Results  Component Value Date   HGBA1C 7.2 (A) 12/19/2019      Assessment & Plan:   Problem List Items Addressed This Visit      Endocrine   Diabetes mellitus type II, non insulin dependent (HCC) - Primary    Patient here for follow-up on type II DM. Since her last visit 3 month ago, she has lost 22 lbs which has primarily come from committing to a fast with her church group. She has also been making an effort to exercise more. She is amazed at how energized and how much better she feels overall.  Her blood sugars the last 3 weeks have been in the low 100s. She admits to eating poorly through April and May which likely explains her slight A1C increase to 7.2.  I encouraged her that with continued lifestyle modifications and Metformin she would likely achieve a normal A1C by her next visit.       Relevant Orders   POC Hbg A1C (Completed)   CMP14 + Anion Gap (Completed)     Musculoskeletal and Integument   Primary osteoarthritis of both knees    Knee pain significantly improved with recent weight loss. She is able to power walk long distances without any difficulty.       Genital warts    Patient decided not to keep her dermatology appointment because the areas that were bothering her resolved on their own.        Other Visit Diagnoses    Screening for hyperlipidemia       Relevant Orders   Lipid Profile (Completed)      No orders of the defined types were placed in this encounter.   Follow-up: Return in about 3 months (around 03/20/2020) for DM .    Bridget Hartshorn, DO

## 2019-12-19 NOTE — Patient Instructions (Signed)
Sheena Soto, It was a pleasure seeing you today! Congrats on your weight loss so far. Keep up the good work!   We discussed your A1C of 7.2 today. If you continue with your diet modifications and Metformin, I anticipate it will be back to a normal level at the next check based on how good your sugars have looked lately.   Take care, and we'll see you in 3 months!  Dr. Chesley Mires

## 2019-12-20 ENCOUNTER — Encounter: Payer: Self-pay | Admitting: Internal Medicine

## 2019-12-20 LAB — CMP14 + ANION GAP
ALT: 21 IU/L (ref 0–32)
AST: 25 IU/L (ref 0–40)
Albumin/Globulin Ratio: 1.1 — ABNORMAL LOW (ref 1.2–2.2)
Albumin: 4 g/dL (ref 3.8–4.8)
Alkaline Phosphatase: 84 IU/L (ref 48–121)
Anion Gap: 17 mmol/L (ref 10.0–18.0)
BUN/Creatinine Ratio: 4 — ABNORMAL LOW (ref 9–23)
BUN: 3 mg/dL — ABNORMAL LOW (ref 6–24)
Bilirubin Total: 0.4 mg/dL (ref 0.0–1.2)
CO2: 18 mmol/L — ABNORMAL LOW (ref 20–29)
Calcium: 9.7 mg/dL (ref 8.7–10.2)
Chloride: 101 mmol/L (ref 96–106)
Creatinine, Ser: 0.83 mg/dL (ref 0.57–1.00)
GFR calc Af Amer: 100 mL/min/{1.73_m2} (ref >59)
GFR calc non Af Amer: 87 mL/min/{1.73_m2} (ref >59)
Globulin, Total: 3.8 g/dL (ref 1.5–4.5)
Glucose: 101 mg/dL — ABNORMAL HIGH (ref 65–99)
Potassium: 4.1 mmol/L (ref 3.5–5.2)
Sodium: 136 mmol/L (ref 134–144)
Total Protein: 7.8 g/dL (ref 6.0–8.5)

## 2019-12-20 LAB — LIPID PANEL
Chol/HDL Ratio: 4.4 ratio (ref 0.0–4.4)
Cholesterol, Total: 140 mg/dL (ref 100–199)
HDL: 32 mg/dL — ABNORMAL LOW (ref >39)
LDL Chol Calc (NIH): 88 mg/dL (ref 0–99)
Triglycerides: 109 mg/dL (ref 0–149)
VLDL Cholesterol Cal: 20 mg/dL (ref 5–40)

## 2019-12-20 NOTE — Assessment & Plan Note (Signed)
Knee pain significantly improved with recent weight loss. She is able to power walk long distances without any difficulty.

## 2019-12-20 NOTE — Assessment & Plan Note (Signed)
Patient here for follow-up on type II DM. Since her last visit 3 month ago, she has lost 22 lbs which has primarily come from committing to a fast with her church group. She has also been making an effort to exercise more. She is amazed at how energized and how much better she feels overall.  Her blood sugars the last 3 weeks have been in the low 100s. She admits to eating poorly through April and May which likely explains her slight A1C increase to 7.2.  I encouraged her that with continued lifestyle modifications and Metformin she would likely achieve a normal A1C by her next visit.

## 2019-12-20 NOTE — Assessment & Plan Note (Signed)
Patient decided not to keep her dermatology appointment because the areas that were bothering her resolved on their own.

## 2019-12-21 DIAGNOSIS — Z20822 Contact with and (suspected) exposure to covid-19: Secondary | ICD-10-CM | POA: Diagnosis not present

## 2019-12-21 DIAGNOSIS — Z03818 Encounter for observation for suspected exposure to other biological agents ruled out: Secondary | ICD-10-CM | POA: Diagnosis not present

## 2019-12-25 NOTE — Progress Notes (Signed)
Internal Medicine Clinic Attending  Case discussed with Dr. Bloomfield  At the time of the visit.  We reviewed the resident's history and exam and pertinent patient test results.  I agree with the assessment, diagnosis, and plan of care documented in the resident's note.  

## 2020-02-05 ENCOUNTER — Other Ambulatory Visit: Payer: Self-pay | Admitting: Obstetrics and Gynecology

## 2020-02-07 ENCOUNTER — Other Ambulatory Visit: Payer: Self-pay

## 2020-02-07 MED ORDER — PREPLUS 27-1 MG PO TABS: 1.0000 | ORAL_TABLET | Freq: Every day | ORAL | 1 refills | Status: DC

## 2020-02-07 NOTE — Progress Notes (Signed)
Pt requests PNV rf  Pt needs an annual Transferred call to the appt desk

## 2020-02-25 ENCOUNTER — Ambulatory Visit (INDEPENDENT_AMBULATORY_CARE_PROVIDER_SITE_OTHER): Payer: BC Managed Care – PPO | Admitting: Internal Medicine

## 2020-02-25 ENCOUNTER — Other Ambulatory Visit: Payer: Self-pay

## 2020-02-25 ENCOUNTER — Encounter: Payer: Self-pay | Admitting: Internal Medicine

## 2020-02-25 VITALS — BP 124/80 | HR 75 | Temp 98.3°F | Ht 65.75 in | Wt 285.2 lb

## 2020-02-25 DIAGNOSIS — E119 Type 2 diabetes mellitus without complications: Secondary | ICD-10-CM

## 2020-02-25 DIAGNOSIS — R82998 Other abnormal findings in urine: Secondary | ICD-10-CM

## 2020-02-25 DIAGNOSIS — Z0000 Encounter for general adult medical examination without abnormal findings: Secondary | ICD-10-CM

## 2020-02-25 LAB — POCT GLYCOSYLATED HEMOGLOBIN (HGB A1C): Hemoglobin A1C: 7 % — AB (ref 4.0–5.6)

## 2020-02-25 LAB — POCT URINALYSIS DIPSTICK
Bilirubin, UA: NEGATIVE
Blood, UA: NEGATIVE
Glucose, UA: NEGATIVE
Ketones, UA: NEGATIVE
Leukocytes, UA: NEGATIVE
Nitrite, UA: NEGATIVE
Protein, UA: NEGATIVE
Spec Grav, UA: 1.02 (ref 1.010–1.025)
Urobilinogen, UA: 0.2 E.U./dL
pH, UA: 6 (ref 5.0–8.0)

## 2020-02-25 LAB — GLUCOSE, CAPILLARY: Glucose-Capillary: 86 mg/dL (ref 70–99)

## 2020-02-25 NOTE — Assessment & Plan Note (Addendum)
Sheena Soto is a 44 yo F w/ PMH of T2DM, GERD, Obesity presenting to [facility] w/ complaint of dark brown urine. She mentions that she was in her usual state of health until last Wednesday when she noticed dark discoloration of her urine for 3 days. She mentions the dark color resolved after 3 days. She denies any dysuria, urgency or frequency. Mentions that her period started day prior to onset of her symptoms. 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  A/P Presented w/ complaint of urine discoloration. Dipstick ua in office negative for blood, biliuria or proteinuria. Provided reassurance and advised likely was seeing menstrual blood.  - Reassurance provided

## 2020-02-25 NOTE — Patient Instructions (Addendum)
Thank you for allowing [place] to provide your care today. Today we discussed your dark urine    I have ordered urine analysis, hemoglobin a1c for you. I will call if any are abnormal.    Today we made no changes to your medications.    Please follow-up in 3 months.    Should you have any questions or concerns please call the internal medicine clinic at [phone].     Dehydration, Adult Dehydration is condition in which there is not enough water or other fluids in the body. This happens when a person loses more fluids than he or she takes in. Important body parts cannot work right without the right amount of fluids. Any loss of fluids from the body can cause dehydration. Dehydration can be mild, worse, or very bad. It should be treated right away to keep it from getting very bad. What are the causes? This condition may be caused by:  Conditions that cause loss of water or other fluids, such as: ? Watery poop (diarrhea). ? Vomiting. ? Sweating a lot. ? Peeing (urinating) a lot.  Not drinking enough fluids, especially when you: ? Are ill. ? Are doing things that take a lot of energy to do.  Other illnesses and conditions, such as fever or infection.  Certain medicines, such as medicines that take extra fluid out of the body (diuretics).  Lack of safe drinking water.  Not being able to get enough water and food. What increases the risk? The following factors may make you more likely to develop this condition:  Having a long-term (chronic) illness that has not been treated the right way, such as: ? Diabetes. ? Heart disease. ? Kidney disease.  Being 36 years of age or older.  Having a disability.  Living in a place that is high above the ground or sea (high in altitude). The thinner, dried air causes more fluid loss.  Doing exercises that put stress on your body for a long time. What are the signs or symptoms? Symptoms of dehydration depend on how bad it is. Mild or worse  dehydration  Thirst.  Dry lips or dry mouth.  Feeling dizzy or light-headed, especially when you stand up from sitting.  Muscle cramps.  Your body making: ? Dark pee (urine). Pee may be the color of tea. ? Less pee than normal. ? Less tears than normal.  Headache. Very bad dehydration  Changes in skin. Skin may: ? Be cold to the touch (clammy). ? Be blotchy or pale. ? Not go back to normal right after you lightly pinch it and let it go.  Little or no tears, pee, or sweat.  Changes in vital signs, such as: ? Fast breathing. ? Low blood pressure. ? Weak pulse. ? Pulse that is more than 100 beats a minute when you are sitting still.  Other changes, such as: ? Feeling very thirsty. ? Eyes that look hollow (sunken). ? Cold hands and feet. ? Being mixed up (confused). ? Being very tired (lethargic) or having trouble waking from sleep. ? Short-term weight loss. ? Loss of consciousness. How is this treated? Treatment for this condition depends on how bad it is. Treatment should start right away. Do not wait until your condition gets very bad. Very bad dehydration is an emergency. You will need to go to a hospital.  Mild or worse dehydration can be treated at home. You may be asked to: ? Drink more fluids. ? Drink an oral rehydration solution (ORS).  This drink helps get the right amounts of fluids and salts and minerals in the blood (electrolytes).  Very bad dehydration can be treated: ? With fluids through an IV tube. ? By getting normal levels of salts and minerals in your blood. This is often done by giving salts and minerals through a tube. The tube is passed through your nose and into your stomach. ? By treating the root cause. Follow these instructions at home: Oral rehydration solution If told by your doctor, drink an ORS:  Make an ORS. Use instructions on the package.  Start by drinking small amounts, about  cup (120 mL) every 5-10 minutes.  Slowly drink more  until you have had the amount that your doctor said to have. Eating and drinking         Drink enough clear fluid to keep your pee pale yellow. If you were told to drink an ORS, finish the ORS first. Then, start slowly drinking other clear fluids. Drink fluids such as: ? Water. Do not drink only water. Doing that can make the salt (sodium) level in your body get too low. ? Water from ice chips you suck on. ? Fruit juice that you have added water to (diluted). ? Low-calorie sports drinks.  Eat foods that have the right amounts of salts and minerals, such as: ? Bananas. ? Oranges. ? Potatoes. ? Tomatoes. ? Spinach.  Do not drink alcohol.  Avoid: ? Drinks that have a lot of sugar. These include:  High-calorie sports drinks.  Fruit juice that you did not add water to.  Soda.  Caffeine. ? Foods that are greasy or have a lot of fat or sugar. General instructions  Take over-the-counter and prescription medicines only as told by your doctor.  Do not take salt tablets. Doing that can make the salt level in your body get too high.  Return to your normal activities as told by your doctor. Ask your doctor what activities are safe for you.  Keep all follow-up visits as told by your doctor. This is important. Contact a doctor if:  You have pain in your belly (abdomen) and the pain: ? Gets worse. ? Stays in one place.  You have a rash.  You have a stiff neck.  You get angry or annoyed (irritable) more easily than normal.  You are more tired or have a harder time waking than normal.  You feel: ? Weak or dizzy. ? Very thirsty. Get help right away if you have:  Any symptoms of very bad dehydration.  Symptoms of vomiting, such as: ? You cannot eat or drink without vomiting. ? Your vomiting gets worse or does not go away. ? Your vomit has blood or green stuff in it.  Symptoms that get worse with treatment.  A fever.  A very bad headache.  Problems with peeing or  pooping (having a bowel movement), such as: ? Watery poop that gets worse or does not go away. ? Blood in your poop (stool). This may cause poop to look black and tarry. ? Not peeing in 6-8 hours. ? Peeing only a small amount of very dark pee in 6-8 hours.  Trouble breathing. These symptoms may be an emergency. Do not wait to see if the symptoms will go away. Get medical help right away. Call your local emergency services (911 in the [place] Do not drive yourself to the hospital. Summary  Dehydration is a condition in which there is not enough water or other fluids in  the body. This happens when a person loses more fluids than he or she takes in.  Treatment for this condition depends on how bad it is. Treatment should be started right away. Do not wait until your condition gets very bad.  Drink enough clear fluid to keep your pee pale yellow. If you were told to drink an oral rehydration solution (ORS), finish the ORS first. Then, start slowly drinking other clear fluids.  Take over-the-counter and prescription medicines only as told by your doctor.  Get help right away if you have any symptoms of very bad dehydration. This information is not intended to replace advice given to you by your health care provider. Make sure you discuss any questions you have with your health care provider. Document Revised: 01/11/2019 Document Reviewed: 01/11/2019 Elsevier Patient Education  2020 [employer].

## 2020-02-25 NOTE — Assessment & Plan Note (Signed)
Discussed importance of getting COVID vaccination. Sheena Soto expressed understanding. Information regarding COVID vaccine and location involved provided to patient.

## 2020-02-25 NOTE — Assessment & Plan Note (Signed)
Lab Results  Component Value Date   HGBA1C 7.0 (A) 02/25/2020   Mentions missing metformin doses some time as she has been more intent on using lifestyle modifications for management. She has continued to lose additional 10lbs since her last visit with exercise and reduced oral intake. Denies any polyuria, polydipsia, polyphagia. Not checking her blood sugars regularly but mentions feeling well overall.  A/P  Clinic scale shows no significant weight changes but hemoglobin a1c with improvement to 7.0 from 7.2 last visit. May need not need metformin if continued weight loss but advised to continue at this time. - C/w metformin 500mg  BID, lifestyle modifications - F/u in 3 months

## 2020-02-25 NOTE — Progress Notes (Signed)
   CC: dark urine  HPI: [name] is a 44 y.o. with PMH listed below presenting with complaint of dark urine. Please see problem based assessment and plan for further details.  Past Medical History:  Diagnosis Date  . Diabetes mellitus type II, non insulin dependent (HCC) 03/27/2018   Review of Systems: Review of Systems  Constitutional: Negative for chills, fever and malaise/fatigue.  Respiratory: Negative for shortness of breath.   Cardiovascular: Negative for chest pain and leg swelling.  Gastrointestinal: Negative for constipation, diarrhea, nausea and vomiting.  Genitourinary: Negative for dysuria, flank pain, frequency and urgency.  Neurological: Negative for dizziness.     Physical Exam: Vitals:   02/25/20 1500 02/25/20 1506 02/25/20 1542  BP: 138/80 135/81 124/80  Pulse: 76 75   Temp: 98.3 F (36.8 C)    TempSrc: Oral    SpO2: 100%    Weight: 285 lb 3.2 oz (129.4 kg)    Height: 5' 5.75" (1.67 m)     Gen: Well-developed, well nourished, NAD HEENT: NCAT head, hearing intact CV: RRR, S1, S2 normal Pulm: CTAB, No rales, no wheezes GI: BS+, NTND, No CVA tenderness Extm: ROM intact, Peripheral pulses intact, No peripheral edema Skin: Dry, Warm, normal turgor  Assessment & Plan:   Diabetes mellitus type II, non insulin dependent (HCC) Lab Results  Component Value Date   HGBA1C 7.0 (A) 02/25/2020   Mentions missing metformin doses some time as she has been more intent on using lifestyle modifications for management. She has continued to lose additional 10lbs since her last visit with exercise and reduced oral intake. Denies any polyuria, polydipsia, polyphagia. Not checking her blood sugars regularly but mentions feeling well overall.  A/P  Clinic scale shows no significant weight changes but hemoglobin a1c with improvement to 7.0 from 7.2 last visit. May need not need metformin if continued weight loss but advised to continue at this time. - C/w  metformin 500mg  BID, lifestyle modifications - F/u in 3 months  Dark brown urine [name] is a 44 yo F w/ PMH of T2DM, GERD, Obesity presenting to [facility] w/ complaint of dark brown urine. She mentions that she was in her usual state of health until last Wednesday when she noticed dark discoloration of her urine for 3 days. She mentions the dark color resolved after 3 days. She denies any dysuria, urgency or frequency. Mentions that her period started day prior to onset of her symptoms. 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  A/P Presented w/ complaint of urine discoloration. Dipstick ua in office negative for blood, biliuria or proteinuria. Provided reassurance and advised likely was seeing menstrual blood.  - Reassurance provided  Healthcare maintenance Discussed importance of getting COVID vaccination. [name] expressed understanding. Information regarding COVID vaccine and location involved provided to patient.   Patient discussed with Dr. Friday  -Oswaldo Done, PGY3 [facility] Internal Medicine Pager: [phone]

## 2020-02-26 LAB — MICROALBUMIN / CREATININE URINE RATIO
Creatinine, Urine: 63.7 mg/dL
Microalb/Creat Ratio: 26 mg/g creat (ref 0–29)
Microalbumin, Urine: 16.5 ug/mL

## 2020-02-26 NOTE — Progress Notes (Signed)
Internal Medicine Clinic Attending  Case discussed with Dr. Lee  At the time of the visit.  We reviewed the resident's history and exam and pertinent patient test results.  I agree with the assessment, diagnosis, and plan of care documented in the resident's note.    

## 2020-02-29 ENCOUNTER — Other Ambulatory Visit: Payer: Self-pay | Admitting: Internal Medicine

## 2020-02-29 DIAGNOSIS — Z1231 Encounter for screening mammogram for malignant neoplasm of breast: Secondary | ICD-10-CM

## 2020-03-06 ENCOUNTER — Telehealth: Payer: Self-pay | Admitting: Internal Medicine

## 2020-03-06 ENCOUNTER — Other Ambulatory Visit: Payer: Self-pay

## 2020-03-06 ENCOUNTER — Ambulatory Visit: Payer: BC Managed Care – PPO | Admitting: Obstetrics and Gynecology

## 2020-03-06 ENCOUNTER — Encounter: Payer: Self-pay | Admitting: Obstetrics and Gynecology

## 2020-03-06 ENCOUNTER — Ambulatory Visit (INDEPENDENT_AMBULATORY_CARE_PROVIDER_SITE_OTHER): Payer: BC Managed Care – PPO | Admitting: Obstetrics and Gynecology

## 2020-03-06 ENCOUNTER — Other Ambulatory Visit (HOSPITAL_COMMUNITY)
Admission: RE | Admit: 2020-03-06 | Discharge: 2020-03-06 | Disposition: A | Discharge status: Home / Self Care | Payer: BC Managed Care – PPO | Source: Ambulatory Visit | Attending: Obstetrics and Gynecology | Admitting: Obstetrics and Gynecology

## 2020-03-06 VITALS — BP 119/78 | HR 83 | Ht 65.0 in | Wt 282.0 lb

## 2020-03-06 DIAGNOSIS — N760 Acute vaginitis: Secondary | ICD-10-CM | POA: Insufficient documentation

## 2020-03-06 DIAGNOSIS — Z01419 Encounter for gynecological examination (general) (routine) without abnormal findings: Secondary | ICD-10-CM

## 2020-03-06 DIAGNOSIS — R102 Pelvic and perineal pain: Secondary | ICD-10-CM | POA: Diagnosis not present

## 2020-03-06 DIAGNOSIS — Z113 Encounter for screening for infections with a predominantly sexual mode of transmission: Secondary | ICD-10-CM | POA: Diagnosis not present

## 2020-03-06 DIAGNOSIS — B9689 Other specified bacterial agents as the cause of diseases classified elsewhere: Secondary | ICD-10-CM | POA: Insufficient documentation

## 2020-03-06 DIAGNOSIS — Z124 Encounter for screening for malignant neoplasm of cervix: Secondary | ICD-10-CM

## 2020-03-06 MED ORDER — PREPLUS 27-1 MG PO TABS: 1.0000 | ORAL_TABLET | Freq: Every day | ORAL | 12 refills | Status: DC

## 2020-03-06 NOTE — Telephone Encounter (Signed)
Sheena Soto is requesting an appointment per pt schedule with Dr. Graciela Husbands. She stated in the comments "My heart rate keeps rising and dropping. The lowest was 52 and the highest 135 all while resting. I felt tightness in my chest and then it released." An appointment has not been scheduled due to Dr. Graciela Husbands not having any available appointments before 07/01/19 and waiting on her response about being scheduled with a PA. Please advise.

## 2020-03-06 NOTE — Progress Notes (Signed)
Patient presents for Annual Exam Today.Pt has made life changes and is doing  well with weight loss.   Last pap:01/29/2019 WNL  LMP:  02/16/20-02/20/20 with light flow first days then next day 2-3 will be heavy w/aching pain. Contraception: None  STD Screening : Desires Full panel  Family Hx of Breast Cancer: None  Family Hx of Ovarian cancer : None    CC: Patient notes recent episodes of lower abdominal pain .

## 2020-03-06 NOTE — Progress Notes (Signed)
Subjective:     Sheena Soto is a 44 y.o. female P1 with LMP 02/16/20 and BMI 46 who is here for a comprehensive physical exam. The patient reports no problems. Patient is not sexually active. She reports a monthly 5 day cycle. She reports occasional abdominal pain which she attributes to gas. She denies abnormal discharge. She denies urinary incontinence. Patient did not get married. She is still hoping to conceive with the right person  Past Medical History:  Diagnosis Date  . Diabetes mellitus type II, non insulin dependent (HCC) 03/27/2018   Past Surgical History:  Procedure Laterality Date  . BREAST BIOPSY Left   . BREAST EXCISIONAL BIOPSY Left    Pt stated she had mass removed, doctor not sure what it was  . BREAST SURGERY    . CESAREAN SECTION    . HERNIA REPAIR     Family History  Problem Relation Age of Onset  . Diabetes Mother   . Hypertension Mother   . Atrial fibrillation Mother   . Diabetes Maternal Grandmother   . Hypertension Maternal Grandmother   . Heart attack Maternal Uncle 81    Social History   Socioeconomic History  . Marital status: Single    Spouse name: Not on file  . Number of children: Not on file  . Years of education: Not on file  . Highest education level: Not on file  Occupational History  . Not on file  Tobacco Use  . Smoking status: Never Smoker  . Smokeless tobacco: Never Used  Vaping Use  . Vaping Use: Never used  Substance and Sexual Activity  . Alcohol use: Yes    Comment: occassionally  . Drug use: Never  . Sexual activity: Yes    Birth control/protection: None  Other Topics Concern  . Not on file  Social History Narrative   Currently moved to [place] 6 months ago   Lives with daughter   Social Determinants of Health   Financial Resource Strain:   . Difficulty of Paying Living Expenses: Not on file  Food Insecurity:   . Worried About Programme researcher, broadcasting/film/video in the Last Year: Not on file  . Ran Out of Food in the  Last Year: Not on file  Transportation Needs:   . Lack of Transportation (Medical): Not on file  . Lack of Transportation (Non-Medical): Not on file  Physical Activity:   . Days of Exercise per Week: Not on file  . Minutes of Exercise per Session: Not on file  Stress:   . Feeling of Stress : Not on file  Social Connections:   . Frequency of Communication with Friends and Family: Not on file  . Frequency of Social Gatherings with Friends and Family: Not on file  . Attends Religious Services: Not on file  . Active Member of Clubs or Organizations: Not on file  . Attends Banker Meetings: Not on file  . Marital Status: Not on file  Intimate Partner Violence:   . Fear of Current or Ex-Partner: Not on file  . Emotionally Abused: Not on file  . Physically Abused: Not on file  . Sexually Abused: Not on file   Health Maintenance  Topic Date Due  . COVID-19 Vaccine (1) Never done  . FOOT EXAM  01/01/2020  . INFLUENZA VACCINE  Never done  . MAMMOGRAM  03/14/2020  . HEMOGLOBIN A1C  05/26/2020  . OPHTHALMOLOGY EXAM  06/17/2020  . URINE MICROALBUMIN  02/24/2021  . PAP SMEAR-Modifier  01/28/2022  . TETANUS/TDAP  12/31/2028  . PNEUMOCOCCAL POLYSACCHARIDE VACCINE AGE 44-64 HIGH RISK  Completed  . Hepatitis C Screening  Completed  . HIV Screening  Completed       Review of Systems Pertinent items noted in HPI and remainder of comprehensive ROS otherwise negative.   Objective:  Blood pressure 119/78, pulse 83, height 5\' 5"  (1.651 m), weight 282 lb (127.9 kg), last menstrual period 02/16/2020, unknown if currently breastfeeding.      GENERAL: Well-developed, well-nourished female in no acute distress. Obese HEENT: Normocephalic, atraumatic. Sclerae anicteric.  NECK: Supple. Normal thyroid.  LUNGS: Clear to auscultation bilaterally.  HEART: Regular rate and rhythm. BREASTS: Symmetric in size. No palpable masses or lymphadenopathy, skin changes, or nipple  drainage. ABDOMEN: Soft, nontender, nondistended. No organomegaly. PELVIC: Normal external female genitalia. Vagina is pink and rugated.  Normal discharge. Normal appearing cervix. Uterus is normal in size. No adnexal mass or tenderness. EXTREMITIES: No cyanosis, clubbing, or edema, 2+ distal pulses.    Assessment:    Healthy female exam.      Plan:    Pap smear collected Patient due for screening mammogram 03/2020 Patient with elevated A1C this month 7.0 but is steadily losing weight and is motivated to do so Patient will be contacted with abnormal results RTC prn See After Visit Summary for Counseling Recommendations

## 2020-03-06 NOTE — Telephone Encounter (Signed)
Spoke with pt who states she had an issue with her stove yesterday and she felt as if she was smothering when she turned the stove on.  She put the pulse oximeter on her finger and it registered HR at 56 and O2 in the 60's.  This in turned caused anxiety so pt turned off oximeter and retook her vital signs.  At this point her HR was 115 and O2 was in the 90's.  Pt states she continued to take HR and O2 throughout the evening and readings were heart rates in he 80's and O2 in the 90's.  Pt states she had a Dr visit this am and all vital signs were WNL.  Pt states she is getting a new stove today.  Pt is past due appointment with Dr Graciela Husbands.  Appointment scheduled with Francis Dowse, PA-C for 03/18/2020 for routine follow up.  Reviewed ED precautions with pt.  Pt verbalizes understanding and agrees with current plan.

## 2020-03-06 NOTE — Telephone Encounter (Signed)
Attempted phone call to pt.  Left voicemail message to contact RN at [phone].

## 2020-03-07 LAB — CERVICOVAGINAL ANCILLARY ONLY
Bacterial Vaginitis (gardnerella): POSITIVE — AB
Candida Glabrata: NEGATIVE
Candida Vaginitis: NEGATIVE
Chlamydia: NEGATIVE
Comment: NEGATIVE
Comment: NEGATIVE
Comment: NEGATIVE
Comment: NEGATIVE
Comment: NEGATIVE
Comment: NORMAL
Neisseria Gonorrhea: NEGATIVE
Trichomonas: NEGATIVE

## 2020-03-07 LAB — RPR: RPR Ser Ql: NONREACTIVE

## 2020-03-07 LAB — HEPATITIS C ANTIBODY: Hep C Virus Ab: <0.1 s/co ratio (ref 0.0–0.9)

## 2020-03-07 LAB — HEPATITIS B SURFACE ANTIGEN: Hepatitis B Surface Ag: NEGATIVE

## 2020-03-07 LAB — HIV ANTIBODY (ROUTINE TESTING W REFLEX): HIV Screen 4th Generation wRfx: NONREACTIVE

## 2020-03-08 LAB — URINE CULTURE

## 2020-03-10 MED ORDER — METRONIDAZOLE 500 MG PO TABS: 500.0000 mg | ORAL_TABLET | Freq: Two times a day (BID) | ORAL | 0 refills | Status: DC

## 2020-03-10 NOTE — Addendum Note (Signed)
Addended by: Catalina Antigua on: 03/10/2020 10:10 AM   Modules accepted: Orders

## 2020-03-11 LAB — CYTOLOGY - PAP
Comment: NEGATIVE
Diagnosis: NEGATIVE
High risk HPV: NEGATIVE

## 2020-03-17 NOTE — Progress Notes (Deleted)
Cardiology Office Note Date:  03/17/2020  Patient ID:  Sheena Soto, Sheena Soto [birth year], MRN [id number] PCP:  Bridget Hartshorn, DO  Cardiologist:  Dr. Eldridge Dace Electrophysiologist: Dr. Graciela Husbands  ***refresh   Chief Complaint: *** ?? Over due, symptoms  History of Present Illness: Sheena Soto is a 44 y.o. female with history of DM, morbid obesity, OSA, *** CPAP,  NSVT  Palpitations with H/o NS-VT - with structurally normal heart and normal SA ECG.    ? FHx of sudden death (single vehicle MVA)  - cMRI 12/2018 with normal LV and RVEF with no LGE. No evidence of ARVC.  She comes in today to be seen for Dr. Graciela Husbands and [place]. [place], [place],  last seen by him July 2020.  AT that time she was getting ready to start college and so was her daughter. No changes were made, she was pending a sleep study and encouraged weight loss  Telephone notes report recent increase in palpitations, ?reduced O2 sats, she thinks perhaps connected to a problem with her stove?  She had a GYN visit that day with stable VS.  *** symptoms *** CPAP? Sleep study? *** meds *** labs, lipids, lytes July *** repeat 2 week AT?    Past Medical History:  Diagnosis Date  . Diabetes mellitus type II, non insulin dependent (HCC) 03/27/2018    Past Surgical History:  Procedure Laterality Date  . BREAST BIOPSY Left   . BREAST EXCISIONAL BIOPSY Left    Pt stated she had mass removed, doctor not sure what it was  . BREAST SURGERY    . CESAREAN SECTION    . HERNIA REPAIR      Current Outpatient Medications  Medication Sig Dispense Refill  . glucose blood (ONE TOUCH ULTRA TEST) test strip Use as instructed 100 each 12  . Lancets (ACCU-CHEK SAFE-T PRO) lancets Use as instructed 100 each 12  . metFORMIN (GLUCOPHAGE) 500 MG tablet TAKE 1 TABLET BY MOUTH TWICE DAILY WITH A MEAL 180 tablet 1  . metroNIDAZOLE (FLAGYL) 500 MG tablet Take 1 tablet (500 mg total) by mouth 2 (two) times daily. 14 tablet 0  . nystatin cream  (MYCOSTATIN) Apply 1 application topically 2 (two) times daily. 30 g 1  . Prenatal Vit-Fe Fumarate-FA (PREPLUS) 27-1 MG TABS Take 1 tablet by mouth daily. 30 tablet 12   No current facility-administered medications for this visit.    Allergies:   Naprosyn [naproxen]   Social History:  The patient  reports that she has never smoked. She has never used smokeless tobacco. She reports previous alcohol use. She reports that she does not use drugs.   Family History:  The patient's family history includes Atrial fibrillation in her mother; Diabetes in her maternal grandmother and mother; Fibroids in her mother; Heart attack (age of onset: 24) in her maternal uncle; Hypertension in her maternal grandmother and mother.  ROS:  Please see the history of present illness.    All other systems are reviewed and otherwise negative.   PHYSICAL EXAM:  VS:  There were no vitals taken for this visit. BMI: There is no height or weight on file to calculate BMI. Well nourished, well developed, in no acute distress HEENT: normocephalic, atraumatic Neck: no JVD, carotid bruits or masses Cardiac:  *** RRR; no significant murmurs, no rubs, or gallops Lungs:  *** CTA b/l, no wheezing, rhonchi or rales Abd: soft, nontender MS: no deformity or *** atrophy Ext: *** no edema Skin: warm and dry, no rash  Neuro:  No gross deficits appreciated Psych: euthymic mood, full affect   EKG:  Done today and reviewed by myself shows  ***  12/22/2018: c.MRI IMPRESSION: 1.  Normal LV size and systolic function, EF 62%. 2. Normal RV size and systolic function, EF 53%. No evidence for ARVC. 3. No myocardial LGE, so no definitive evidence for prior MI, infiltrative disease or myocarditis.   08/16/2018: TTE IMPRESSIONS  1. The left ventricle has normal systolic function with an ejection  fraction of 60-65%. The cavity size was mildly dilated. Left ventricular  diastolic parameters were normal.  2. The right ventricle has  normal systolic function. The cavity was  normal. There is no increase in right ventricular wall thickness.  3. Left atrial size was mildly dilated.  4. The mitral valve is degenerative. Mild thickening of the mitral valve  leaflet. Mild calcification of the mitral valve leaflet. There is mild  mitral annular calcification present.  5. The tricuspid valve is normal in structure.  6. The aortic valve is tricuspid Mild sclerosis of the aortic valve.  7. The pulmonic valve was normal in structure.    07/27/2018: 30 day monitor  Normal sinus rhythm.  One episode of 13 beats of nonsustained ventricular tachycardia. No symptoms noted.    Recent Labs: 11/27/2019: Hemoglobin 13.0; Platelets 358 12/19/2019: ALT 21; BUN 3; Creatinine, Ser 0.83; Potassium 4.1; Sodium 136  12/19/2019: Chol/HDL Ratio 4.4; Cholesterol, Total 140; HDL 32; LDL Chol Calc (NIH) 88; Triglycerides 109   CrCl cannot be calculated (Patient's most recent lab result is older than the maximum 21 days allowed.).   Wt Readings from Last 3 Encounters:  03/06/20 282 lb (127.9 kg)  02/25/20 285 lb 3.2 oz (129.4 kg)  12/19/19 279 lb 8 oz (126.8 kg)     Other studies reviewed: Additional studies/records reviewed today include: summarized above  ASSESSMENT AND PLAN:  1. Palpitations 2. NSVT     ***  Disposition: F/u with ***  Current medicines are reviewed at length with the patient today.  The patient did not have any concerns regarding medicines.  Norma Fredrickson, PA-C 03/17/2020 6:21 AM     [facility] [address] [place] [address] [phone] (office)  [phone] (fax)

## 2020-03-18 ENCOUNTER — Ambulatory Visit: Payer: BC Managed Care – PPO | Admitting: Physician Assistant

## 2020-03-20 ENCOUNTER — Ambulatory Visit
Admission: RE | Admit: 2020-03-20 | Discharge: 2020-03-20 | Disposition: A | Discharge status: Home / Self Care | Payer: BC Managed Care – PPO | Source: Ambulatory Visit | Attending: Obstetrics and Gynecology | Admitting: Obstetrics and Gynecology

## 2020-03-20 ENCOUNTER — Other Ambulatory Visit: Payer: Self-pay

## 2020-03-20 DIAGNOSIS — Z01419 Encounter for gynecological examination (general) (routine) without abnormal findings: Secondary | ICD-10-CM

## 2020-04-02 ENCOUNTER — Other Ambulatory Visit: Payer: Self-pay

## 2020-04-02 ENCOUNTER — Encounter: Payer: Self-pay | Admitting: Physician Assistant

## 2020-04-02 ENCOUNTER — Ambulatory Visit (INDEPENDENT_AMBULATORY_CARE_PROVIDER_SITE_OTHER): Payer: Self-pay | Admitting: Physician Assistant

## 2020-04-02 VITALS — BP 124/76 | HR 83 | Ht 64.0 in | Wt 290.0 lb

## 2020-04-02 DIAGNOSIS — I472 Ventricular tachycardia: Secondary | ICD-10-CM

## 2020-04-02 DIAGNOSIS — I4729 Other ventricular tachycardia: Secondary | ICD-10-CM

## 2020-04-02 NOTE — Progress Notes (Signed)
Cardiology Office Note Date:  04/02/2020  Patient ID:  [name], [name] [birth year], MRN [id number] PCP:  Bridget Hartshorn, DO  Cardiologist:  Dr. Eldridge Dace Electrophysiologist: Dr. Graciela Husbands    Chief Complaint: Over due, symptoms  History of Present Illness: Sheena Soto is a 44 y.o. female with history of DM, morbid obesity, OSA, does not use CPAP,  NSVT  Palpitations with H/o NS-VT - with structurally normal heart and normal SA ECG.    ? FHx of sudden death (single vehicle MVA)  - cMRI 12/2018 with normal LV and RVEF with no LGE. No evidence of ARVC.  She comes in today to be seen for Dr. Graciela Husbands and [place]. [place], [place],  last seen by him July 2020.  AT that time she was getting ready to start college and so was her daughter. No changes were made, she was pending a sleep study and encouraged weight loss  Telephone notes in Sept report recent increase in palpitations, ?reduced O2 sats, she thinks perhaps connected to a problem with her stove?  She had a GYN visit that day with stable VS.  TODAY She is doing very well. She is exercising, dieting and actively losing weight.  She has a trainer that sends her workouts to complete and this has been very good for her. 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 She has a new stove and hs not had that opressive feeling since then,. She explains that she rarely cooks at home, and had not used the stove in months and when she put the oven on, eventually developed an unusual feeling of SOB, went to the kitchen and became aware of a unusual fume/odor cut off the stove and went outside and sat in her car with the a/c.  She almost immediately felt ebtter and eventually her HR and O2 levels evened out. Out sideof that she has had no cardiac awareness, no dizzy spells, near syncope or syncope. She has occassional sharp fleeting CP as well as chest wall  tenderness that she feels is 2/2 to her large breasts and chest wall aching. Since losing weight she no longer wakes though does still snore. No SOB, feels like she has good exertional capacity with her exercising and ADLs    Past Medical History:  Diagnosis Date  . Diabetes mellitus type II, non insulin dependent (HCC) 03/27/2018    Past Surgical History:  Procedure Laterality Date  . BREAST BIOPSY Left   . BREAST EXCISIONAL BIOPSY Left    Pt stated she had mass removed, doctor not sure what it was  . BREAST SURGERY    . CESAREAN SECTION    . HERNIA REPAIR      Current Outpatient Medications  Medication Sig Dispense Refill  . glucose blood (ONE TOUCH ULTRA TEST) test strip Use as instructed 100 each 12  . Lancets (ACCU-CHEK SAFE-T PRO) lancets Use as instructed 100 each 12  . metFORMIN (GLUCOPHAGE) 500 MG tablet TAKE 1 TABLET BY MOUTH TWICE DAILY WITH A MEAL 180 tablet 1  . metroNIDAZOLE (FLAGYL) 500 MG tablet Take 1 tablet (500 mg total) by mouth 2 (two) times daily. 14 tablet 0  . nystatin cream (MYCOSTATIN) Apply 1 application topically 2 (two) times daily. 30 g 1  . Prenatal Vit-Fe Fumarate-FA (PREPLUS) 27-1 MG TABS Take 1 tablet by mouth daily. 30 tablet 12   No current facility-administered  medications for this visit.    Allergies:   Naprosyn [naproxen]   Social History:  The patient  reports that she has never smoked. She has never used smokeless tobacco. She reports previous alcohol use. She reports that she does not use drugs.   Family History:  The patient's family history includes Atrial fibrillation in her mother; Diabetes in her maternal grandmother and mother; Fibroids in her mother; Heart attack (age of onset: 14) in her maternal uncle; Hypertension in her maternal grandmother and mother.  ROS:  Please see the history of present illness.    All other systems are reviewed and otherwise negative.   PHYSICAL EXAM:  VS:  There were no vitals taken for this  visit. BMI: There is no height or weight on file to calculate BMI. Well nourished, well developed, in no acute distress HEENT: normocephalic, atraumatic Neck: no JVD, carotid bruits or masses Cardiac:  RRR; no significant murmurs, no rubs, or gallops Lungs:  CTA b/l, no wheezing, rhonchi or rales Abd: soft, nontender MS: no deformity or atrophy Ext:  no edema Skin: warm and dry, no rash Neuro:  No gross deficits appreciated Psych: euthymic mood, full affect   EKG:  Done 11/27/2019 and reviewed by myself shows  SR 70bpm, nonspecific T changes apprear largly unchanged from prior  12/22/2018: c.MRI IMPRESSION: 1.  Normal LV size and systolic function, EF 62%. 2. Normal RV size and systolic function, EF 53%. No evidence for ARVC. 3. No myocardial LGE, so no definitive evidence for prior MI, infiltrative disease or myocarditis.   08/16/2018: TTE IMPRESSIONS  1. The left ventricle has normal systolic function with an ejection  fraction of 60-65%. The cavity size was mildly dilated. Left ventricular  diastolic parameters were normal.  2. The right ventricle has normal systolic function. The cavity was  normal. There is no increase in right ventricular wall thickness.  3. Left atrial size was mildly dilated.  4. The mitral valve is degenerative. Mild thickening of the mitral valve  leaflet. Mild calcification of the mitral valve leaflet. There is mild  mitral annular calcification present.  5. The tricuspid valve is normal in structure.  6. The aortic valve is tricuspid Mild sclerosis of the aortic valve.  7. The pulmonic valve was normal in structure.    07/27/2018: 30 day monitor  Normal sinus rhythm.  One episode of 13 beats of nonsustained ventricular tachycardia. No symptoms noted.    Recent Labs: 11/27/2019: Hemoglobin 13.0; Platelets 358 12/19/2019: ALT 21; BUN 3; Creatinine, Ser 0.83; Potassium 4.1; Sodium 136  12/19/2019: Chol/HDL Ratio 4.4; Cholesterol, Total 140;  HDL 32; LDL Chol Calc (NIH) 88; Triglycerides 109   CrCl cannot be calculated (Patient's most recent lab result is older than the maximum 21 days allowed.).   Wt Readings from Last 3 Encounters:  03/06/20 282 lb (127.9 kg)  02/25/20 285 lb 3.2 oz (129.4 kg)  12/19/19 279 lb 8 oz (126.8 kg)     Other studies reviewed: Additional studies/records reviewed today include: summarized above  ASSESSMENT AND PLAN:  1. Palpitations 2. NSVT     No palpitations or syncope  Encouraged to revisit sleep study, she reports years ago being tested and known to have apnea, though with weight loss thinks this has improved and would like to continue this 1st. She will let us know should she decide to pursue it  Disposition: F/u with [place] in a year, sooner if needed   Current medicines are reviewed at length with  the patient today.  The patient did not have any concerns regarding medicines.  Norma Fredrickson, PA-C 04/02/2020 6:45 AM     [facility] [address] [place] [address] [phone] (office)  [phone] (fax)

## 2020-04-02 NOTE — Patient Instructions (Signed)
Medication Instructions:  Your physician recommends that you continue on your current medications as directed. Please refer to the Current Medication list given to you today.  *If you need a refill on your cardiac medications before your next appointment, please call your pharmacy*   Lab Work: NONE ORDERED  TODAY  If you have labs (blood work) drawn today and your tests are completely normal, you will receive your results only by: . MyChart Message (if you have MyChart) OR . A paper copy in the mail If you have any lab test that is abnormal or we need to change your treatment, we will call you to review the results.   Testing/Procedures: NONE ORDERED  TODAY   Follow-Up: At [facility], you and your health needs are our priority.  As part of our continuing mission to provide you with exceptional heart care, we have created designated Provider Care Teams.  These Care Teams include your primary Cardiologist (physician) and Advanced Practice Providers (APPs -  Physician Assistants and Nurse Practitioners) who all work together to provide you with the care you need, when you need it.  We recommend signing up for the patient portal called "MyChart".  Sign up information is provided on this After Visit Summary.  MyChart is used to connect with patients for Virtual Visits (Telemedicine).  Patients are able to view lab/test results, encounter notes, upcoming appointments, etc.  Non-urgent messages can be sent to your provider as well.   To learn more about what you can do with MyChart, go to [email or web address].    Your next appointment:   1 year(s)  The format for your next appointment:   In Person  Provider:   You may see Dr. Klein  or one of the following Advanced Practice Providers on your designated Care Team:    Amber Seiler, NP  Renee Ursuy, PA-C  Michael "Andy" Tillery, PA-C    Other Instructions   

## 2020-05-23 ENCOUNTER — Encounter (HOSPITAL_COMMUNITY): Payer: Self-pay | Admitting: Emergency Medicine

## 2020-05-23 ENCOUNTER — Emergency Department (HOSPITAL_COMMUNITY)
Admission: EM | Admit: 2020-05-23 | Discharge: 2020-05-23 | Disposition: A | Discharge status: Home / Self Care | Payer: Medicaid Other | Attending: Emergency Medicine | Admitting: Emergency Medicine

## 2020-05-23 ENCOUNTER — Emergency Department (HOSPITAL_COMMUNITY): Payer: Medicaid Other

## 2020-05-23 ENCOUNTER — Ambulatory Visit (HOSPITAL_COMMUNITY)
Admission: EM | Admit: 2020-05-23 | Discharge: 2020-05-23 | Discharge status: Against Medical Advice | Payer: Medicaid Other

## 2020-05-23 ENCOUNTER — Other Ambulatory Visit: Payer: Self-pay

## 2020-05-23 DIAGNOSIS — M25571 Pain in right ankle and joints of right foot: Secondary | ICD-10-CM | POA: Insufficient documentation

## 2020-05-23 DIAGNOSIS — E119 Type 2 diabetes mellitus without complications: Secondary | ICD-10-CM | POA: Insufficient documentation

## 2020-05-23 DIAGNOSIS — Z7984 Long term (current) use of oral hypoglycemic drugs: Secondary | ICD-10-CM | POA: Insufficient documentation

## 2020-05-23 MED ORDER — HYDROCODONE-ACETAMINOPHEN 5-325 MG PO TABS: 1.0000 | ORAL_TABLET | Freq: Four times a day (QID) | ORAL | 0 refills | Status: DC | PRN

## 2020-05-23 NOTE — ED Triage Notes (Signed)
Pt arrives at ED with complaints of right sided ankle pain x2 days. Pt states that she woke up with right ankle pain and was unsure as of why. States no falls or twisting of ankle. Ankle pain is too intense for pt to walk on.

## 2020-05-23 NOTE — Discharge Instructions (Addendum)
Please return for any problem.  Follow-up with orthopedics as instructed.

## 2020-05-23 NOTE — ED Provider Notes (Signed)
[facility] EMERGENCY DEPARTMENT Provider Note   CSN: [id number] Arrival date & time: 05/23/20  1652     History Chief Complaint  Patient presents with  . Ankle Pain    Sheena Soto is a 44 y.o. female.  44 year old female for medical history as detailed below presents for evaluation of right ankle pain.  Patient reports distant history of multiple prior right ankle strains and sprains.  Patient reports that for 2 days she has had increased pain to the medial aspect of the right ankle.  She cannot recall any specific injury.  She does admit to being on her feet more than normal while taking care of a sick family member.  Pain is worse with ambulation.  Patient denies fever.  She reports using Tylenol and Motrin with some improvement in her pain.  The history is provided by the patient and medical records.  Ankle Pain Location:  Ankle Time since incident:  2 days Injury: no   Ankle location:  R ankle Pain details:    Quality:  Aching   Severity:  Mild   Onset quality:  Gradual   Duration:  2 days   Timing:  Constant   Progression:  Waxing and waning Chronicity:  New Dislocation: no   Foreign body present:  No foreign bodies Prior injury to area:  Yes Relieved by:  Acetaminophen, elevation and ice Worsened by:  Bearing weight      Past Medical History:  Diagnosis Date  . Diabetes mellitus type II, non insulin dependent (HCC) 03/27/2018    Patient Active Problem List   Diagnosis Date Noted  . Dark brown urine 02/25/2020  . Primary osteoarthritis of both knees 01/01/2019  . Cervical cancer screening 01/01/2019  . Healthcare maintenance 01/01/2019  . Anxiety 07/07/2018  . Gastroesophageal reflux disease 07/07/2018  . Obstructive sleep apnea 07/07/2018  . Diabetes mellitus type II, non insulin dependent (HCC) 03/27/2018    Past Surgical History:  Procedure Laterality Date  . BREAST BIOPSY Left   . BREAST EXCISIONAL BIOPSY Left    Pt  stated she had mass removed, doctor not sure what it was  . BREAST SURGERY    . CESAREAN SECTION    . HERNIA REPAIR       OB History    Gravida  3   Para  1   Term  1   Preterm      AB  2   Living  1     SAB  1   IAB  1   Ectopic      Multiple      Live Births  1           Family History  Problem Relation Age of Onset  . Diabetes Mother   . Hypertension Mother   . Atrial fibrillation Mother   . Fibroids Mother   . Diabetes Maternal Grandmother   . Hypertension Maternal Grandmother   . Heart attack Maternal Uncle 38    Social History   Tobacco Use  . Smoking status: Never Smoker  . Smokeless tobacco: Never Used  Vaping Use  . Vaping Use: Never used  Substance Use Topics  . Alcohol use: Not Currently  . Drug use: Never    Home Medications Prior to Admission medications   Medication Sig Start Date End Date Taking? Authorizing Provider  metFORMIN (GLUCOPHAGE) 500 MG tablet TAKE 1 TABLET BY MOUTH TWICE DAILY WITH A MEAL 09/18/19   [place], Braddock D, DO  nystatin cream (MYCOSTATIN) Apply 1 application topically 2 (two) times daily. Patient taking differently: Apply 1 application topically as needed.  01/29/19   Constant, Peggy, MD  Prenatal Vit-Fe Fumarate-FA (PREPLUS) 27-1 MG TABS Take 1 tablet by mouth daily. 03/06/20   Constant, Peggy, MD    Allergies    Naprosyn [naproxen]  Review of Systems   Review of Systems  All other systems reviewed and are negative.   Physical Exam Updated Vital Signs BP 113/75 (BP Location: Right Arm)   Pulse 81   Temp 98.9 F (37.2 C) (Oral)   Resp 16   Ht 5\' 5"  (1.651 m)   Wt 130.6 kg   LMP 04/26/2020   SpO2 100%   BMI 47.93 kg/m   Physical Exam Vitals and nursing note reviewed.  Constitutional:      General: She is not in acute distress.    Appearance: She is well-developed and well-nourished.  HENT:     Head: Normocephalic and atraumatic.     Mouth/Throat:     Mouth: Oropharynx is clear and  moist.  Eyes:     Extraocular Movements: EOM normal.     Conjunctiva/sclera: Conjunctivae normal.     Pupils: Pupils are equal, round, and reactive to light.  Cardiovascular:     Rate and Rhythm: Normal rate and regular rhythm.     Heart sounds: Normal heart sounds.  Pulmonary:     Effort: Pulmonary effort is normal. No respiratory distress.     Breath sounds: Normal breath sounds.  Abdominal:     General: There is no distension.     Palpations: Abdomen is soft.     Tenderness: There is no abdominal tenderness.  Musculoskeletal:        General: Tenderness present. No deformity or edema. Normal range of motion.     Cervical back: Normal range of motion and neck supple.     Comments: Mild tenderness overlying the medial malleolus of the right ankle.  No associated erythema, edema, or ecchymosis.  Distal right lower extremity is neurovascular intact.  Skin:    General: Skin is warm and dry.  Neurological:     General: No focal deficit present.     Mental Status: She is alert and oriented to person, place, and time. Mental status is at baseline.  Psychiatric:        Mood and Affect: Mood and affect normal.     ED Results / Procedures / Treatments   Labs (all labs ordered are listed, but only abnormal results are displayed) Labs Reviewed - No data to display  EKG None  Radiology DG Ankle Complete Right  Result Date: 05/23/2020 CLINICAL DATA:  Ankle pain EXAM: RIGHT ANKLE - COMPLETE 3+ VIEW COMPARISON:  None. FINDINGS: Moderate plantar calcaneal spur. Ankle mortise is symmetric. Multiple ossicles or chronic avulsion injuries adjacent to the medial malleolus. IMPRESSION: No acute osseous abnormality. Multiple ossicles or chronic avulsion injuries adjacent to the medial malleolus. Electronically Signed   By: 14/03/2020 M.D.   On: 05/23/2020 18:33    Procedures Procedures (including critical care time)  Medications Ordered in ED Medications - No data to display  ED  Course  I have reviewed the triage vital signs and the nursing notes.  Pertinent labs & imaging results that were available during my care of the patient were reviewed by me and considered in my medical decision making (see chart for details).    MDM Rules/Calculators/A&P  MDM  Screen complete  Sheena Soto was evaluated in Emergency Department on 05/23/2020 for the symptoms described in the history of present illness. She was evaluated in the context of the global COVID-19 pandemic, which necessitated consideration that the patient might be at risk for infection with the SARS-CoV-2 virus that causes COVID-19. Institutional protocols and algorithms that pertain to the evaluation of patients at risk for COVID-19 are in a state of rapid change based on information released by regulatory bodies including the CDC and federal and state organizations. These policies and algorithms were followed during the patient's care in the ED.  Patient is presenting for evaluation of reported right ankle pain.  Patient with distant history of multiple prior right ankle strains and sprains.  Imaging suggest prior chronic injuries to this ankle.  No acute fracture seen on x-ray.  Patient is advised that she should follow closely with orthopedics.  Patient advised to use crutches, remain nonweightbearing as tolerated, and to treat her suspected right ankle strain as outpatient.   Final Clinical Impression(s) / ED Diagnoses Final diagnoses:  Acute right ankle pain    Rx / DC Orders ED Discharge Orders         Ordered    HYDROcodone-acetaminophen (NORCO/VICODIN) 5-325 MG tablet  Every 6 hours PRN        05/23/20 1903           Wynetta Fines, MD 05/23/20 [phone]

## 2020-08-14 ENCOUNTER — Emergency Department (HOSPITAL_COMMUNITY)
Admission: EM | Admit: 2020-08-14 | Discharge: 2020-08-14 | Disposition: A | Discharge status: Home / Self Care | Payer: Medicaid Other | Attending: Emergency Medicine | Admitting: Emergency Medicine

## 2020-08-14 ENCOUNTER — Encounter (HOSPITAL_COMMUNITY): Payer: Self-pay

## 2020-08-14 ENCOUNTER — Other Ambulatory Visit: Payer: Self-pay

## 2020-08-14 DIAGNOSIS — E119 Type 2 diabetes mellitus without complications: Secondary | ICD-10-CM | POA: Insufficient documentation

## 2020-08-14 DIAGNOSIS — Z7984 Long term (current) use of oral hypoglycemic drugs: Secondary | ICD-10-CM | POA: Insufficient documentation

## 2020-08-14 DIAGNOSIS — L509 Urticaria, unspecified: Secondary | ICD-10-CM

## 2020-08-14 LAB — URINALYSIS, ROUTINE W REFLEX MICROSCOPIC
Bilirubin Urine: NEGATIVE
Glucose, UA: NEGATIVE mg/dL
Hgb urine dipstick: NEGATIVE
Ketones, ur: 20 mg/dL — AB
Leukocytes,Ua: NEGATIVE
Nitrite: NEGATIVE
Protein, ur: 30 mg/dL — AB
Specific Gravity, Urine: 1.012 (ref 1.005–1.030)
pH: 5 (ref 5.0–8.0)

## 2020-08-14 MED ORDER — FAMOTIDINE 20 MG PO TABS
20.0000 mg | ORAL_TABLET | Freq: Once | ORAL | Status: AC
  Administered 2020-08-14: 20 mg via ORAL
  Filled 2020-08-14: qty 1

## 2020-08-14 MED ORDER — DIPHENHYDRAMINE HCL 25 MG PO CAPS
25.0000 mg | ORAL_CAPSULE | Freq: Once | ORAL | Status: AC
  Administered 2020-08-14: 25 mg via ORAL
  Filled 2020-08-14: qty 1

## 2020-08-14 NOTE — Discharge Instructions (Addendum)
For itchy rash, use Benadryl 25 mg four times a day and Pepcid 20 mg twice a day. You may need to use these for three or 4 days. It is not clear what is causing the reaction, which is called urticaria or hives. See your doctor if not better next week.

## 2020-08-14 NOTE — ED Provider Notes (Signed)
[facility] EMERGENCY DEPARTMENT Provider Note   CSN: [id number] Arrival date & time: 08/14/20  0209     History Chief Complaint  Patient presents with  . Rash    Sheena Soto is a 45 y.o. female.  HPI She reports onset of itching, generally with a rash on her hands and arms, yesterday evening. She did not take anything for it. She has used some new soap recently but denies other ingestions, or exposures, which are new. No chest pain, shortness of breath, weakness or dizziness. No similar problem in the past. She works for herself. There are no other known modifying factors.  Past Medical History:  Diagnosis Date  . Diabetes mellitus type II, non insulin dependent (HCC) 03/27/2018    Patient Active Problem List   Diagnosis Date Noted  . Dark brown urine 02/25/2020  . Primary osteoarthritis of both knees 01/01/2019  . Cervical cancer screening 01/01/2019  . Healthcare maintenance 01/01/2019  . Anxiety 07/07/2018  . Gastroesophageal reflux disease 07/07/2018  . Obstructive sleep apnea 07/07/2018  . Diabetes mellitus type II, non insulin dependent (HCC) 03/27/2018    Past Surgical History:  Procedure Laterality Date  . BREAST BIOPSY Left   . BREAST EXCISIONAL BIOPSY Left    Pt stated she had mass removed, doctor not sure what it was  . BREAST SURGERY    . CESAREAN SECTION    . HERNIA REPAIR       OB History    Gravida  3   Para  1   Term  1   Preterm      AB  2   Living  1     SAB  1   IAB  1   Ectopic      Multiple      Live Births  1           Family History  Problem Relation Age of Onset  . Diabetes Mother   . Hypertension Mother   . Atrial fibrillation Mother   . Fibroids Mother   . Diabetes Maternal Grandmother   . Hypertension Maternal Grandmother   . Heart attack Maternal Uncle 30    Social History   Tobacco Use  . Smoking status: Never Smoker  . Smokeless tobacco: Never Used  Vaping Use  . Vaping  Use: Never used  Substance Use Topics  . Alcohol use: Not Currently  . Drug use: Never    Home Medications Prior to Admission medications   Medication Sig Start Date End Date Taking? Authorizing Provider  HYDROcodone-acetaminophen (NORCO/VICODIN) 5-325 MG tablet Take 1 tablet by mouth every 6 (six) hours as needed. 05/23/20   Wynetta Fines, MD  metFORMIN (GLUCOPHAGE) 500 MG tablet TAKE 1 TABLET BY MOUTH TWICE DAILY WITH A MEAL 09/18/19   Bloomfield, Carley D, DO  nystatin cream (MYCOSTATIN) Apply 1 application topically 2 (two) times daily. Patient taking differently: Apply 1 application topically as needed.  01/29/19   Constant, Peggy, MD  Prenatal Vit-Fe Fumarate-FA (PREPLUS) 27-1 MG TABS Take 1 tablet by mouth daily. 03/06/20   Constant, Peggy, MD    Allergies    Naprosyn [naproxen]  Review of Systems   Review of Systems  All other systems reviewed and are negative.   Physical Exam Updated Vital Signs BP 130/85 (BP Location: Right Arm)   Pulse 96   Temp 98.3 F (36.8 C) (Oral)   Resp 18   SpO2 97%   Physical Exam Vitals and nursing  note reviewed.  Constitutional:      General: She is not in acute distress.    Appearance: She is well-developed and well-nourished. She is obese. She is not ill-appearing, toxic-appearing or diaphoretic.  HENT:     Head: Normocephalic and atraumatic.     Right Ear: External ear normal.     Left Ear: External ear normal.  Eyes:     Extraocular Movements: EOM normal.     Conjunctiva/sclera: Conjunctivae normal.     Pupils: Pupils are equal, round, and reactive to light.  Neck:     Trachea: Phonation normal.  Cardiovascular:     Rate and Rhythm: Normal rate.  Pulmonary:     Effort: Pulmonary effort is normal. No respiratory distress.     Breath sounds: No stridor.  Chest:     Chest wall: No bony tenderness.  Abdominal:     General: There is no distension.  Musculoskeletal:        General: Normal range of motion.     Cervical back:  Normal range of motion and neck supple.  Skin:    General: Skin is warm, dry and intact.     Coloration: Skin is not jaundiced.     Comments: No visible rash currently  Neurological:     Mental Status: She is alert and oriented to person, place, and time.     Cranial Nerves: No cranial nerve deficit.     Sensory: No sensory deficit.     Motor: No abnormal muscle tone.     Coordination: Coordination normal.  Psychiatric:        Mood and Affect: Mood and affect and mood normal.        Behavior: Behavior normal.        Thought Content: Thought content normal.        Judgment: Judgment normal.     ED Results / Procedures / Treatments   Labs (all labs ordered are listed, but only abnormal results are displayed) Labs Reviewed  URINALYSIS, ROUTINE W REFLEX MICROSCOPIC - Abnormal; Notable for the following components:      Result Value   Ketones, ur 20 (*)    Protein, ur 30 (*)    Bacteria, UA RARE (*)    All other components within normal limits    EKG None  Radiology No results found.  Procedures Procedures   Medications Ordered in ED Medications  diphenhydrAMINE (BENADRYL) capsule 25 mg (has no administration in time range)  famotidine (PEPCID) tablet 20 mg (has no administration in time range)    ED Course  I have reviewed the triage vital signs and the nursing notes.  Pertinent labs & imaging results that were available during my care of the patient were reviewed by me and considered in my medical decision making (see chart for details).  Clinical Course as of 08/14/20 0904  Thu Aug 14, 2020  0901 Patient showed me pictures on her iPhone which indicates an urticarial rash of her left dorsal hand. [EW]    Clinical Course User Index [EW] Mancel Bale, MD   MDM Rules/Calculators/A&P                           Patient Vitals for the past 24 hrs:  BP Temp Temp src Pulse Resp SpO2  08/14/20 0639 130/85 -- -- 96 18 97 %  08/14/20 0321 140/81 -- -- 99 19 100 %   08/14/20 0234 139/88 98.3 F (36.8 C)  Oral (!) 109 18 98 %    9:01 AM Reevaluation with update and discussion. After initial assessment and treatment, an updated evaluation reveals no change in clinical status, I discussed with the patient all questions were answered. Mancel Bale   Medical Decision Making:  This patient is presenting for evaluation of itchy rash, which high require a range of treatment options, and is not a complaint that involves a high risk of morbidity and mortality. The differential diagnoses include allergic reaction, stress reaction. I decided to review old records, and in summary middle-aged female presenting with evaluation consistent with hives, etiology not clear historically.  I did not require additional historical information from anyone.    Critical Interventions-evaluation, discussion with patient  After These Interventions, the Patient was reevaluated and was found stable for discharge. Nonspecific urticaria, without internal allergic reaction  CRITICAL CARE-no Performed by: Mancel Bale  Nursing Notes Reviewed/ Care Coordinated Applicable Imaging Reviewed Interpretation of Laboratory Data incorporated into ED treatment  The patient appears reasonably screened and/or stabilized for discharge and I doubt any other medical condition or other [facility] requiring further screening, evaluation, or treatment in the ED at this time prior to discharge.  Plan: Home Medications-OTC antihistamine of choice; Home Treatments-rest, fluids; return here if the recommended treatment, does not improve the symptoms; Recommended follow up-DCP, PRN     Final Clinical Impression(s) / ED Diagnoses Final diagnoses:  Hives    Rx / DC Orders ED Discharge Orders    None       Mancel Bale, MD 08/14/20 [phone]

## 2020-08-14 NOTE — ED Triage Notes (Signed)
Pt reports that after work she broke out in a rash on her arms and legs, new soap over the past week, no SOB, pt also reports dark urine

## 2020-10-11 ENCOUNTER — Ambulatory Visit (HOSPITAL_COMMUNITY)
Admission: EM | Admit: 2020-10-11 | Discharge: 2020-10-11 | Disposition: A | Discharge status: Home / Self Care | Payer: Medicaid Other | Attending: Emergency Medicine | Admitting: Emergency Medicine

## 2020-10-11 ENCOUNTER — Encounter (HOSPITAL_COMMUNITY): Payer: Self-pay

## 2020-10-11 ENCOUNTER — Other Ambulatory Visit: Payer: Self-pay

## 2020-10-11 DIAGNOSIS — N898 Other specified noninflammatory disorders of vagina: Secondary | ICD-10-CM

## 2020-10-11 DIAGNOSIS — B3731 Acute candidiasis of vulva and vagina: Secondary | ICD-10-CM

## 2020-10-11 DIAGNOSIS — Z113 Encounter for screening for infections with a predominantly sexual mode of transmission: Secondary | ICD-10-CM

## 2020-10-11 DIAGNOSIS — B373 Candidiasis of vulva and vagina: Secondary | ICD-10-CM | POA: Diagnosis not present

## 2020-10-11 DIAGNOSIS — Z3202 Encounter for pregnancy test, result negative: Secondary | ICD-10-CM | POA: Diagnosis not present

## 2020-10-11 LAB — POCT URINALYSIS DIPSTICK, ED / UC
Bilirubin Urine: NEGATIVE
Glucose, UA: NEGATIVE mg/dL
Ketones, ur: NEGATIVE mg/dL
Leukocytes,Ua: NEGATIVE
Nitrite: NEGATIVE
Protein, ur: 30 mg/dL — AB
Specific Gravity, Urine: 1.02 (ref 1.005–1.030)
Urobilinogen, UA: 0.2 mg/dL (ref 0.0–1.0)
pH: 5.5 (ref 5.0–8.0)

## 2020-10-11 LAB — POC URINE PREG, ED: Preg Test, Ur: NEGATIVE

## 2020-10-11 MED ORDER — FLUCONAZOLE 150 MG PO TABS: 150.0000 mg | ORAL_TABLET | Freq: Every day | ORAL | 0 refills | Status: DC

## 2020-10-11 NOTE — ED Triage Notes (Signed)
Pt presents with vaginal itching X 1 week. 

## 2020-10-11 NOTE — ED Provider Notes (Signed)
[facility]    CSN: [id number] Arrival date & time: 10/11/20  1506      History   Chief Complaint Chief Complaint  Patient presents with  . Vaginal Itching    HPI Sheena Soto is a 45 y.o. female.   Patient here for evaluation of vaginal irritation and itching.  Patient does report using a exfoliating soap and recently getting waxed for the first time.  Developed itching and irritation shortly afterwards.  Denies any significant odor or discharge.  Also reports being sexually active recently and would like to be tested for STIs.  Also reports menstrual period is approximately 9 days late.  Denies any trauma, injury, or other precipitating event.  Denies any specific alleviating or aggravating factors.  Denies any fevers, chest pain, shortness of breath, N/V/D, numbness, tingling, weakness, abdominal pain, or headaches.   ROS: As per HPI, all other pertinent ROS negative   The history is provided by the patient.  Vaginal Itching    Past Medical History:  Diagnosis Date  . Diabetes mellitus type II, non insulin dependent (HCC) 03/27/2018    Patient Active Problem List   Diagnosis Date Noted  . Dark brown urine 02/25/2020  . Primary osteoarthritis of both knees 01/01/2019  . Cervical cancer screening 01/01/2019  . Healthcare maintenance 01/01/2019  . Anxiety 07/07/2018  . Gastroesophageal reflux disease 07/07/2018  . Obstructive sleep apnea 07/07/2018  . Diabetes mellitus type II, non insulin dependent (HCC) 03/27/2018    Past Surgical History:  Procedure Laterality Date  . BREAST BIOPSY Left   . BREAST EXCISIONAL BIOPSY Left    Pt stated she had mass removed, doctor not sure what it was  . BREAST SURGERY    . CESAREAN SECTION    . HERNIA REPAIR      OB History    Gravida  3   Para  1   Term  1   Preterm      AB  2   Living  1     SAB  1   IAB  1   Ectopic      Multiple      Live Births  1            Home Medications     Prior to Admission medications   Medication Sig Start Date End Date Taking? Authorizing Provider  fluconazole (DIFLUCAN) 150 MG tablet Take 1 tablet (150 mg total) by mouth daily. Take one tablet now and one in 3 days if you are still having symptoms 10/11/20  Yes Ivette Loyal, NP  HYDROcodone-acetaminophen (NORCO/VICODIN) 5-325 MG tablet Take 1 tablet by mouth every 6 (six) hours as needed. 05/23/20   Wynetta Fines, MD  metFORMIN (GLUCOPHAGE) 500 MG tablet TAKE 1 TABLET BY MOUTH TWICE DAILY WITH A MEAL 09/18/19   Bloomfield, Carley D, DO  nystatin cream (MYCOSTATIN) Apply 1 application topically 2 (two) times daily. Patient taking differently: Apply 1 application topically as needed.  01/29/19   Constant, Peggy, MD  Prenatal Vit-Fe Fumarate-FA (PREPLUS) 27-1 MG TABS Take 1 tablet by mouth daily. 03/06/20   Constant, Peggy, MD    Family History Family History  Problem Relation Age of Onset  . Diabetes Mother   . Hypertension Mother   . Atrial fibrillation Mother   . Fibroids Mother   . Diabetes Maternal Grandmother   . Hypertension Maternal Grandmother   . Heart attack Maternal Uncle 81    Social History Social History  Tobacco Use  . Smoking status: Never Smoker  . Smokeless tobacco: Never Used  Vaping Use  . Vaping Use: Never used  Substance Use Topics  . Alcohol use: Not Currently  . Drug use: Never     Allergies   Naprosyn [naproxen]   Review of Systems Review of Systems  Genitourinary: Positive for menstrual problem. Negative for dysuria, genital sores, vaginal bleeding, vaginal discharge and vaginal pain.     Physical Exam Triage Vital Signs ED Triage Vitals  Enc Vitals Group     BP 10/11/20 1557 126/60     Pulse Rate 10/11/20 1557 81     Resp 10/11/20 1557 17     Temp 10/11/20 1557 98.5 F (36.9 C)     Temp Source 10/11/20 1557 Oral     SpO2 10/11/20 1557 99 %     Weight --      Height --      Head Circumference --      Peak Flow --      Pain  Score 10/11/20 1555 0     Pain Loc --      Pain Edu? --      Excl. in GC? --    No data found.  Updated Vital Signs BP 126/60 (BP Location: Right Arm)   Pulse 81   Temp 98.5 F (36.9 C) (Oral)   Resp 17   LMP 08/31/2020   SpO2 99%   Visual Acuity Right Eye Distance:   Left Eye Distance:   Bilateral Distance:    Right Eye Near:   Left Eye Near:    Bilateral Near:     Physical Exam Vitals and nursing note reviewed.  Constitutional:      General: She is not in acute distress.    Appearance: Normal appearance. She is not ill-appearing, toxic-appearing or diaphoretic.  HENT:     Head: Normocephalic and atraumatic.  Eyes:     Conjunctiva/sclera: Conjunctivae normal.  Cardiovascular:     Rate and Rhythm: Normal rate.     Pulses: Normal pulses.  Pulmonary:     Effort: Pulmonary effort is normal.  Abdominal:     General: Abdomen is flat.  Genitourinary:    Comments: declines Musculoskeletal:        General: Normal range of motion.     Cervical back: Normal range of motion.  Skin:    General: Skin is warm and dry.  Neurological:     General: No focal deficit present.     Mental Status: She is alert and oriented to person, place, and time.  Psychiatric:        Mood and Affect: Mood normal.      UC Treatments / Results  Labs (all labs ordered are listed, but only abnormal results are displayed) Labs Reviewed  POCT URINALYSIS DIPSTICK, ED / UC - Abnormal; Notable for the following components:      Result Value   Hgb urine dipstick TRACE (*)    Protein, ur 30 (*)    All other components within normal limits  POC URINE PREG, ED  CERVICOVAGINAL ANCILLARY ONLY    EKG   Radiology No results found.  Procedures Procedures (including critical care time)  Medications Ordered in UC Medications - No data to display  Initial Impression / Assessment and Plan / UC Course  I have reviewed the triage vital signs and the nursing notes.  Pertinent labs & imaging  results that were available during my care of the patient  were reviewed by me and considered in my medical decision making (see chart for details).     Assessment negative for red flags or concerns.  Urinalysis positive for hemoglobin and protein but otherwise negative with no signs of infection.  Urine pregnancy was negative.  Symptoms likely are caused by vaginal yeast infection.  Will treat with Diflucan today and repeat in 3 days if no resolution of symptoms.  Self swab obtained and will treat based on results.  Safe sex practices discussed including condoms or other barrier method use.  Follow-up with primary care as needed.  Final Clinical Impressions(s) / UC Diagnoses   Final diagnoses:  Vaginal itching  Screening for STDs (sexually transmitted diseases)  Negative pregnancy test  Candida vaginitis     Discharge Instructions     Your pregnancy test was negative.  Your urine does not show any signs of a UTI.   Take the Diflucan once today and again in 3 days if your symptoms have not resolved.   We will contact you with the results from your lab work and any additional treatment.    Do not have sex while taking undergoing treatment for STI.  Make sure that all of your partners get tested and treated.   Use a condom or other barrier method for all sexual encounters.    Return or go to the Emergency Department if symptoms worsen or do not improve in the next few days.      ED Prescriptions    Medication Sig Dispense Auth. Provider   fluconazole (DIFLUCAN) 150 MG tablet Take 1 tablet (150 mg total) by mouth daily. Take one tablet now and one in 3 days if you are still having symptoms 2 tablet Ivette Loyal, NP     PDMP not reviewed this encounter.   Ivette Loyal, NP 10/11/20 [phone]

## 2020-10-11 NOTE — Discharge Instructions (Addendum)
Your pregnancy test was negative.  Your urine does not show any signs of a UTI.   Take the Diflucan once today and again in 3 days if your symptoms have not resolved.   We will contact you with the results from your lab work and any additional treatment.    Do not have sex while taking undergoing treatment for STI.  Make sure that all of your partners get tested and treated.   Use a condom or other barrier method for all sexual encounters.    Return or go to the Emergency Department if symptoms worsen or do not improve in the next few days.

## 2020-10-13 LAB — CERVICOVAGINAL ANCILLARY ONLY
Bacterial Vaginitis (gardnerella): NEGATIVE
Candida Glabrata: NEGATIVE
Candida Vaginitis: POSITIVE — AB
Chlamydia: NEGATIVE
Comment: NEGATIVE
Comment: NEGATIVE
Comment: NEGATIVE
Comment: NEGATIVE
Comment: NEGATIVE
Comment: NORMAL
Neisseria Gonorrhea: NEGATIVE
Trichomonas: NEGATIVE

## 2020-11-11 ENCOUNTER — Other Ambulatory Visit: Payer: Self-pay | Admitting: Internal Medicine

## 2020-11-11 DIAGNOSIS — E119 Type 2 diabetes mellitus without complications: Secondary | ICD-10-CM

## 2020-11-11 NOTE — Telephone Encounter (Signed)
Last office visit 02/25/2020 Refill request sent to appropriate team for review Appt request sent to front office

## 2020-11-19 NOTE — Progress Notes (Deleted)
   CC: ***  HPI:  [name] is a 45 y.o. {GC/GE:3044014::"man","woman"} with history as below who presents to clinic for ***. {GC/GE:3044014::"His","Her"} last clinic visit was on ***.   To see the details of this patient's management of their acute and chronic problems, please refer to the Assessment & Plan under the Encounters tab.    Past Medical History:  Diagnosis Date  . Diabetes mellitus type II, non insulin dependent (HCC) 03/27/2018   Review of Systems:    ROS  Physical Exam:  There were no vitals filed for this visit. Constitutional: well-appearing {GC/GE:3044014::"man","woman"} sitting in chair, in no acute distress HENT: normocephalic atraumatic, mucous membranes moist Eyes: conjunctiva non-erythematous Neck: supple Cardiovascular: regular rate and rhythm, no m/r/g Pulmonary/Chest: normal work of breathing on room air, lungs clear to auscultation bilaterally Abdominal: soft, non-tender, non-distended MSK: normal bulk and tone Neurological: alert & oriented x 3, 5/5 strength in bilateral upper and lower extremities, normal gait Skin: warm and dry*** Psych: ***    Assessment & Plan:   See Encounters Tab for problem-based charting.  Patient {GC/GE:3044014::"discussed with","seen with"} Dr. {NAMES:3044014::"Williams","Guilloud","Hoffman","Mullen","Narendra","Raines","Vincent"}

## 2020-11-20 ENCOUNTER — Ambulatory Visit: Payer: Medicaid Other | Admitting: Student

## 2020-11-20 ENCOUNTER — Encounter: Payer: Self-pay | Admitting: Internal Medicine

## 2020-11-21 ENCOUNTER — Encounter (HOSPITAL_COMMUNITY): Payer: Self-pay | Admitting: Emergency Medicine

## 2020-11-21 ENCOUNTER — Emergency Department (HOSPITAL_COMMUNITY)
Admission: EM | Admit: 2020-11-21 | Discharge: 2020-11-21 | Disposition: A | Discharge status: Home / Self Care | Payer: Medicaid Other | Attending: Emergency Medicine | Admitting: Emergency Medicine

## 2020-11-21 ENCOUNTER — Encounter: Payer: Self-pay | Admitting: *Deleted

## 2020-11-21 ENCOUNTER — Other Ambulatory Visit: Payer: Self-pay

## 2020-11-21 DIAGNOSIS — R0989 Other specified symptoms and signs involving the circulatory and respiratory systems: Secondary | ICD-10-CM

## 2020-11-21 DIAGNOSIS — E119 Type 2 diabetes mellitus without complications: Secondary | ICD-10-CM | POA: Insufficient documentation

## 2020-11-21 DIAGNOSIS — Z7984 Long term (current) use of oral hypoglycemic drugs: Secondary | ICD-10-CM | POA: Insufficient documentation

## 2020-11-21 MED ORDER — ALUM & MAG HYDROXIDE-SIMETH 200-200-20 MG/5ML PO SUSP
30.0000 mL | Freq: Once | ORAL | Status: AC
  Administered 2020-11-21: 01:00:00 30 mL via ORAL
  Filled 2020-11-21: qty 30

## 2020-11-21 MED ORDER — LIDOCAINE VISCOUS HCL 2 % MT SOLN
15.0000 mL | Freq: Once | OROMUCOSAL | Status: AC
  Administered 2020-11-21: 01:00:00 15 mL via ORAL
  Filled 2020-11-21: qty 15

## 2020-11-21 NOTE — ED Triage Notes (Signed)
Pt has been able to keep down GI cocktail. States relief of throat discomfort.

## 2020-11-21 NOTE — ED Provider Notes (Signed)
Emergency Medicine Provider Triage Evaluation Note  Sheena Soto , a 45 y.o. female  was evaluated in triage.  Pt complains of foreign body sensation in throat.  She is concerned that there is a "dinner mint" stuck in her throat.  One of the buttermint varieties.  She complains of throat tightness. Denies vomiting or rash.  .  Review of Systems  Positive: FB sensation in throat Negative: Difficulty breathing  Physical Exam  Ht 5\' 5"  (1.651 m)   Wt 131.1 kg   BMI 48.10 kg/m  Gen:   Awake, no distress   Resp:  Normal effort  MSK:   Moves extremities without difficulty  Other:  Oropharynx is clear  Medical Decision Making  Medically screening exam initiated at 1:11 AM.  Appropriate orders placed.  Sheena Soto was informed that the remainder of the evaluation will be completed by another provider, this initial triage assessment does not replace that evaluation, and the importance of remaining in the ED until their evaluation is complete.     Sheena Quay, PA-C 11/21/20 0113    01/21/21, MD 11/22/20 [phone]

## 2020-11-21 NOTE — ED Triage Notes (Addendum)
Pt states feeling like candy is stuck in throat. Airway intact, able to speak in full sentences. Pt states feeling anxious.

## 2020-11-21 NOTE — Discharge Instructions (Addendum)
Please return to the ER if you are unable to swallow.  Dial 911 if you have any breathing difficulties.  Otherwise, please follow-up with your regular doctor.

## 2020-11-21 NOTE — ED Provider Notes (Signed)
[facility] EMERGENCY DEPARTMENT Provider Note   CSN: [id number] Arrival date & time: 11/21/20  0101     History Chief Complaint  Patient presents with   Food Stuck in Throat    Sheena Soto is a 45 y.o. female.  Patient presents to the emergency department with a chief complaint of foreign body sensation in throat.  She states that she swallowed a butter mint candy tonight and feels like it got stuck in her throat.  She states she has been reluctant to drink water because of the discomfort.  She states that this is making her feel anxious.  She denies any other associated symptoms.  Denies any treatments prior to arrival.  The history is provided by the patient. No language interpreter was used.      Past Medical History:  Diagnosis Date   Diabetes mellitus type II, non insulin dependent (HCC) 03/27/2018    Patient Active Problem List   Diagnosis Date Noted   Dark brown urine 02/25/2020   Primary osteoarthritis of both knees 01/01/2019   Cervical cancer screening 01/01/2019   Healthcare maintenance 01/01/2019   Anxiety 07/07/2018   Gastroesophageal reflux disease 07/07/2018   Obstructive sleep apnea 07/07/2018   Diabetes mellitus type II, non insulin dependent (HCC) 03/27/2018    Past Surgical History:  Procedure Laterality Date   BREAST BIOPSY Left    BREAST EXCISIONAL BIOPSY Left    Pt stated she had mass removed, doctor not sure what it was   BREAST SURGERY     CESAREAN SECTION     HERNIA REPAIR       OB History     Gravida  3   Para  1   Term  1   Preterm      AB  2   Living  1      SAB  1   IAB  1   Ectopic      Multiple      Live Births  1           Family History  Problem Relation Age of Onset   Diabetes Mother    Hypertension Mother    Atrial fibrillation Mother    Fibroids Mother    Diabetes Maternal Grandmother    Hypertension Maternal Grandmother    Heart attack Maternal Uncle 3    Social  History   Tobacco Use   Smoking status: Never   Smokeless tobacco: Never  Vaping Use   Vaping Use: Never used  Substance Use Topics   Alcohol use: Not Currently   Drug use: Never    Home Medications Prior to Admission medications   Medication Sig Start Date End Date Taking? Authorizing Provider  fluconazole (DIFLUCAN) 150 MG tablet Take 1 tablet (150 mg total) by mouth daily. Take one tablet now and one in 3 days if you are still having symptoms 10/11/20   Ivette Loyal, NP  HYDROcodone-acetaminophen (NORCO/VICODIN) 5-325 MG tablet Take 1 tablet by mouth every 6 (six) hours as needed. 05/23/20   Wynetta Fines, MD  metFORMIN (GLUCOPHAGE) 500 MG tablet TAKE 1 TABLET BY MOUTH TWICE DAILY WITH A MEAL 11/11/20   Remo Lipps, MD  nystatin cream (MYCOSTATIN) Apply 1 application topically 2 (two) times daily. Patient taking differently: Apply 1 application topically as needed.  01/29/19   Constant, Peggy, MD  Prenatal Vit-Fe Fumarate-FA (PREPLUS) 27-1 MG TABS Take 1 tablet by mouth daily. 03/06/20   Constant, Gigi Gin, MD  Allergies    Naprosyn [naproxen]  Review of Systems   Review of Systems  All other systems reviewed and are negative.  Physical Exam Updated Vital Signs BP (!) 157/82 (BP Location: Left Arm)   Pulse 81   Temp 98.7 F (37.1 C) (Oral)   Resp (!) 22   Ht 5\' 5"  (1.651 m)   Wt 131.1 kg   SpO2 96%   BMI 48.10 kg/m   Physical Exam Vitals and nursing note reviewed.  Constitutional:      General: She is not in acute distress.    Appearance: She is well-developed.  HENT:     Head: Normocephalic and atraumatic.     Mouth/Throat:     Comments: Oropharynx is clear, no edema, no erythema, no evidence of foreign body Eyes:     Conjunctiva/sclera: Conjunctivae normal.  Cardiovascular:     Rate and Rhythm: Normal rate.     Heart sounds: No murmur heard. Pulmonary:     Effort: Pulmonary effort is normal. No respiratory distress.  Abdominal:     General: There  is no distension.  Musculoskeletal:     Cervical back: Neck supple.     Comments: Moves all extremities  Skin:    General: Skin is warm and dry.  Neurological:     Mental Status: She is alert and oriented to person, place, and time.  Psychiatric:        Mood and Affect: Mood normal.        Behavior: Behavior normal.    ED Results / Procedures / Treatments   Labs (all labs ordered are listed, but only abnormal results are displayed) Labs Reviewed - No data to display  EKG None  Radiology No results found.  Procedures Procedures   Medications Ordered in ED Medications  alum & mag hydroxide-simeth (MAALOX/MYLANTA) 200-200-20 MG/5ML suspension 30 mL (30 mLs Oral Given 11/21/20 0113)    And  lidocaine (XYLOCAINE) 2 % viscous mouth solution 15 mL (15 mLs Oral Given 11/21/20 0113)    ED Course  I have reviewed the triage vital signs and the nursing notes.  Pertinent labs & imaging results that were available during my care of the patient were reviewed by me and considered in my medical decision making (see chart for details).    MDM Rules/Calculators/A&P                         Patient here with foreign body sensation in throat.  She states the symptoms started after swallowing a butter mint candy.  She thinks that it got stuck in her throat and is slowly resolving.  She has complete resolution of her symptoms after GI cocktail, question if she has a small scrape on the back of her throat, or if the GI cocktail helped pass any residual candy.  Either way, she seems stable for discharge.  Symptoms have resolved. Final Clinical Impression(s) / ED Diagnoses Final diagnoses:  Foreign body sensation in throat    Rx / DC Orders ED Discharge Orders     None        01/21/21, PA-C 11/21/20 0313    01/21/21, MD 11/21/20 [phone]

## 2020-11-21 NOTE — ED Notes (Signed)
Patient verbalizes understanding of discharge instructions. Opportunity for questioning and answers were provided. Armband removed by staff, pt discharged from ED ambulatory.   

## 2020-12-02 ENCOUNTER — Other Ambulatory Visit: Payer: Self-pay

## 2020-12-02 ENCOUNTER — Encounter: Payer: Self-pay | Admitting: Student

## 2020-12-02 ENCOUNTER — Ambulatory Visit: Payer: Medicaid Other | Admitting: Student

## 2020-12-02 VITALS — BP 121/80 | HR 89 | Temp 98.3°F | Ht 65.0 in | Wt 297.8 lb

## 2020-12-02 DIAGNOSIS — E119 Type 2 diabetes mellitus without complications: Secondary | ICD-10-CM

## 2020-12-02 LAB — POCT GLYCOSYLATED HEMOGLOBIN (HGB A1C): Hemoglobin A1C: 10.3 % — AB (ref 4.0–5.6)

## 2020-12-02 LAB — GLUCOSE, CAPILLARY: Glucose-Capillary: 235 mg/dL — ABNORMAL HIGH (ref 70–99)

## 2020-12-02 MED ORDER — TRULICITY 1.5 MG/0.5ML ~~LOC~~ SOAJ: 1.5000 mg | SUBCUTANEOUS | 0 refills | Status: DC

## 2020-12-02 MED ORDER — METFORMIN HCL ER 500 MG PO TB24
ORAL_TABLET | ORAL | 0 refills | Status: DC
Start: 2020-12-02 — End: 2021-02-24

## 2020-12-02 MED ORDER — TRULICITY 0.75 MG/0.5ML ~~LOC~~ SOAJ: 0.7500 mg | SUBCUTANEOUS | 0 refills | Status: DC

## 2020-12-02 NOTE — Patient Instructions (Addendum)
[name],   Thank you for your visit to the [facility] today. It was a pleasure seeing you. Today we discussed the following:  1) Diabetes - I have prescribed metformin and provided you with instructions on how to titrate the dose slowly up to 1000 mg twice daily. - I have prescribed Trulicity, which is a once-weekly injection. You will start at 0.75 mg once weekly for Weeks 1-4 and then increase to 1.5 mg once weekly for weeks 5-12. - I have referred you to Lupita Leash, our diabetes educator. - We will repeat your lipid panel in 3 months.   We would like to see you back in 3 months Please bring all of your medications with you.   If you have any questions or concerns, please call our clinic at [phone] between 9am-5pm. Outside of these hours, call [phone] and ask for the internal medicine resident on call. If you feel you are having a medical emergency please call 911.

## 2020-12-02 NOTE — Progress Notes (Signed)
Office Visit   Patient ID: Sheena Soto, female    DOB: 06-Dec-1975, 45 y.o.   MRN: [id number]   PCP: Sheena Hartshorn, DO   Subjective:   CC: diabetes follow-up  HPI:  [name] is a 45 y.o. woman with history of type 2 diabetes mellitus, GERD, obesity, OSA not on CPAP, NSVT who presents to clinic for diabetes follow-up and medication refills. Her last clinic visit was 9 months ago on 02/25/20.    To see the details of this patient's management of their acute and chronic problems, please refer to the Assessment & Plan under the Encounters tab.   Review of Systems:   Review of Systems  Constitutional:  Positive for malaise/fatigue. Negative for chills and fever.  Eyes:  Negative for blurred vision.  Respiratory:  Negative for shortness of breath.   Cardiovascular:  Negative for chest pain, palpitations and leg swelling.  Gastrointestinal:  Negative for abdominal pain.  Genitourinary:  Negative for dysuria.  Neurological:  Negative for dizziness, weakness and headaches.   Past Medical History:  Diagnosis Date   Diabetes mellitus type II, non insulin dependent (HCC) 03/27/2018       ACTIVE MEDICATIONS   Outpatient Medications Prior to Visit  Medication Sig Dispense Refill   metFORMIN (GLUCOPHAGE) 500 MG tablet TAKE 1 TABLET BY MOUTH TWICE DAILY WITH A MEAL 180 tablet 3   Prenatal Vit-Fe Fumarate-FA (PREPLUS) 27-1 MG TABS Take 1 tablet by mouth daily. 30 tablet 12   fluconazole (DIFLUCAN) 150 MG tablet Take 1 tablet (150 mg total) by mouth daily. Take one tablet now and one in 3 days if you are still having symptoms 2 tablet 0   HYDROcodone-acetaminophen (NORCO/VICODIN) 5-325 MG tablet Take 1 tablet by mouth every 6 (six) hours as needed. 6 tablet 0   nystatin cream (MYCOSTATIN) Apply 1 application topically 2 (two) times daily. (Patient taking differently: Apply 1 application topically as needed. ) 30 g 1   No facility-administered medications prior to visit.    Objective:   BP 121/80 (BP Location: Right Arm, Patient Position: Sitting, Cuff Size: Large)   Pulse 89   Temp 98.3 F (36.8 C) (Oral)   Ht 5\' 5"  (1.651 m)   Wt 297 lb 12.8 oz (135.1 kg)   SpO2 91%   BMI 49.56 kg/m  Wt Readings from Last 3 Encounters:  12/02/20 297 lb 12.8 oz (135.1 kg)  11/21/20 289 lb 0.4 oz (131.1 kg)  08/14/20 289 lb (131.1 kg)   BP Readings from Last 3 Encounters:  12/02/20 121/80  11/21/20 (!) 144/86  10/11/20 126/60   Constitutional: very pleasant, obese, well-appearing woman sitting in chair, in no acute distress HENT: normocephalic atraumatic, mucous membranes moist Eyes: conjunctiva non-erythematous Neck: supple, acanthosis nigricans present in the neck folds Cardiovascular: regular rate and rhythm, no m/r/g, no lower extremity edema Pulmonary/Chest: normal work of breathing on room air, lungs clear to auscultation bilaterally Abdominal: soft, non-tender, non-distended MSK: normal bulk and tone Neurological: alert & oriented x 3, normal gait Skin: warm and dry Psych: normal mood and affect, patient becomes tearful when discussing increase in hemoglobin A1c  Health Maintenance:   Health Maintenance  Topic Date Due   COVID-19 Vaccine (1) Never done   OPHTHALMOLOGY EXAM  06/17/2020   INFLUENZA VACCINE  01/12/2021   URINE MICROALBUMIN  02/24/2021   HEMOGLOBIN A1C  03/04/2021   MAMMOGRAM  03/20/2021   FOOT EXAM  12/02/2021   PAP SMEAR-Modifier  03/07/2043   TETANUS/TDAP  12/31/2028   PNEUMOCOCCAL POLYSACCHARIDE VACCINE AGE 44-64 HIGH RISK  Completed   Hepatitis C Screening  Completed   HIV Screening  Completed   Pneumococcal Vaccine 10-44 Years old  Aged Out   HPV VACCINES  Aged Out    Assessment & Plan:   Problem List Items Addressed This Visit       Endocrine   Diabetes mellitus type II, non insulin dependent (HCC) - Primary    Patient presents for diabetes follow-up. She was last seen 9 months ago at which time she was advised to  follow up in 3 months. 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 She has only recently begun to outsource some of the work. She states her diet has been poor during this time, she has noticed increased fatigue, and she is thirstier and urinating more frequently.  Component Ref Range & Units 2 d ago  (12/02/20) 9 mo ago  (02/25/20)  Hemoglobin A1C 4.0 - 5.6 % 10.3 7.0   When told her A1c had increased to 10.3%, she becomes emotional and tearful. She states she has not been investing in her own health like she should. She has gained 12 lb over the last 9 months. She states about 1 year ago she lost 30 lb doing a month-long fast with some church members but she subsequently gained the weight back.  She is prescribed metformin 500 mg twice daily, however she admits she probably only takes it once daily 3-5 times weekly. She is agreeable to referral to Sheena Soto for diabetes counseling/education. She is also agreeable to starting metformin again with instructions of how to titrate up to 1000 mg twice daily. She is hesitant initially however after further discussion is interested in starting Trulicity.   Additionally, patient's ASCVD 10-year risk is calculated to be low at 3.6%. Based on the recommendations for statin therapy in the primary prevention of ASCVD in patients with diabetes who have LDL 70-189 (patient's LDL was 88 on 12/19/19), recommend initiation of a moderate-intensity statin to achieve LDL cholesterol reduction of 30%-49%. Patient prefers to not start a statin at this time because she wants to "do it on her own." She prefers to repeat her lipid panel at her 33-month follow-up. We discussed the fact that her previous lipid panel was obtained prior to her reported worsening of her diet and lifestyle, but she is insistent.    Plan: - Referral to Sheena Soto, our [facility] diabetes educator - Restart metformin 500 mg daily with instructions to titrate gradually to 1000 mg twice daily dosing - Start Trulicity 0.75 mg weekly for weeks 1-4 followed by 1.5 mg weekly for weeks 5-8 if tolerating - Repeat lipid panel at follow-up in 3 months and recommend initiation of moderate to high intensity statin depending on calculated ASCVD risk  ADDENDUM (12/04/20): Sheena Soto spoke briefly with patient to schedule first meeting. Patient would like to hold off on starting a GLP-1 agonist at this time. Of note, patient's chart has Medicaid FP listed however patient states she has [facility] coverage.       Relevant Medications   Dulaglutide (TRULICITY) 0.75 MG/0.5ML SOPN   Dulaglutide (TRULICITY) 1.5 MG/0.5ML SOPN   metFORMIN (GLUCOPHAGE XR) 500 MG 24 hr tablet   Other Relevant Orders   POC Hbg A1C (Completed)   Glucose, capillary (Completed)   Ambulatory  referral to diabetic education    Return in about 3 months (around 03/04/2021) for Diabetes follow-up (including A1c).  Pt discussed with Dr. Antony Contras.  Alphonzo Severance, MD Internal Medicine Resident, PGY-1 [facility] Pager: [phone] 4:10 PM, 12/04/2020

## 2020-12-03 ENCOUNTER — Telehealth: Payer: Self-pay | Admitting: Dietician

## 2020-12-03 NOTE — Telephone Encounter (Signed)
Called Sheena Soto about newly diagnosed diabetes and referral to me. She says she had been "drinking too much sweet tea,  eating out, fried and fast foods".  Says she is covered by [facility] [place] and they covered our last visit. She is now motivated to make changes after seeing how high her A1C is.  She checks blood sugar and knew it was higher.  Wants to try metformin before Trulicity. Set up an appointment for MNT/DSMES and agrees to bring her insurance card.

## 2020-12-04 NOTE — Assessment & Plan Note (Addendum)
Patient presents for diabetes follow-up. She was last seen 9 months ago at which time she was advised to follow up in 3 months. 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 She has only recently begun to outsource some of the work. She states her diet has been poor during this time, she has noticed increased fatigue, and she is thirstier and urinating more frequently.  Component Ref Range & Units 2 d ago  (12/02/20) 9 mo ago  (02/25/20)  Hemoglobin A1C 4.0 - 5.6 % 10.3 7.0   When told her A1c had increased to 10.3%, she becomes emotional and tearful. She states she has not been investing in her own health like she should. She has gained 12 lb over the last 9 months. She states about 1 year ago she lost 30 lb doing a month-long fast with some church members but she subsequently gained the weight back.  She is prescribed metformin 500 mg twice daily, however she admits she probably only takes it once daily 3-5 times weekly. She is agreeable to referral to Lupita Leash for diabetes counseling/education. She is also agreeable to starting metformin again with instructions of how to titrate up to 1000 mg twice daily. She is hesitant initially however after further discussion is interested in starting Trulicity.   Additionally, patient's ASCVD 10-year risk is calculated to be low at 3.6%. Based on the recommendations for statin therapy in the primary prevention of ASCVD in patients with diabetes who have LDL 70-189 (patient's LDL was 88 on 12/19/19), recommend initiation of a moderate-intensity statin to achieve LDL cholesterol reduction of 30%-49%. Patient prefers to not start a statin at this time because she wants to "do it on her own." She prefers to repeat her lipid panel at her 37-month follow-up. We discussed the fact that her previous lipid  panel was obtained prior to her reported worsening of her diet and lifestyle, but she is insistent.   Plan: - Referral to Lupita Leash, our [facility] diabetes educator - Restart metformin 500 mg daily with instructions to titrate gradually to 1000 mg twice daily dosing - Start Trulicity 0.75 mg weekly for weeks 1-4 followed by 1.5 mg weekly for weeks 5-8 if tolerating - Repeat lipid panel at follow-up in 3 months and recommend initiation of moderate to high intensity statin depending on calculated ASCVD risk  ADDENDUM (12/04/20): Lupita Leash spoke briefly with patient to schedule first meeting. Patient would like to hold off on starting a GLP-1 agonist at this time. Of note, patient's chart has Medicaid FP listed however patient states she has [facility] coverage.

## 2020-12-09 NOTE — Progress Notes (Signed)
Internal Medicine Clinic Attending  Case discussed with Dr. Watson  At the time of the visit.  We reviewed the resident's history and exam and pertinent patient test results.  I agree with the assessment, diagnosis, and plan of care documented in the resident's note.  

## 2020-12-15 IMAGING — MG DIGITAL SCREENING BILAT W/ CAD
8 series · 8 of 8 positions shown · non-contrast
Comparison: Previous exam(s).

ACR Breast Density Category a: The breast tissue is almost entirely
fatty.

CLINICAL DATA: Screening.

EXAM:
DIGITAL SCREENING BILATERAL MAMMOGRAM WITH CAD

[R MLO (1 of 2)]
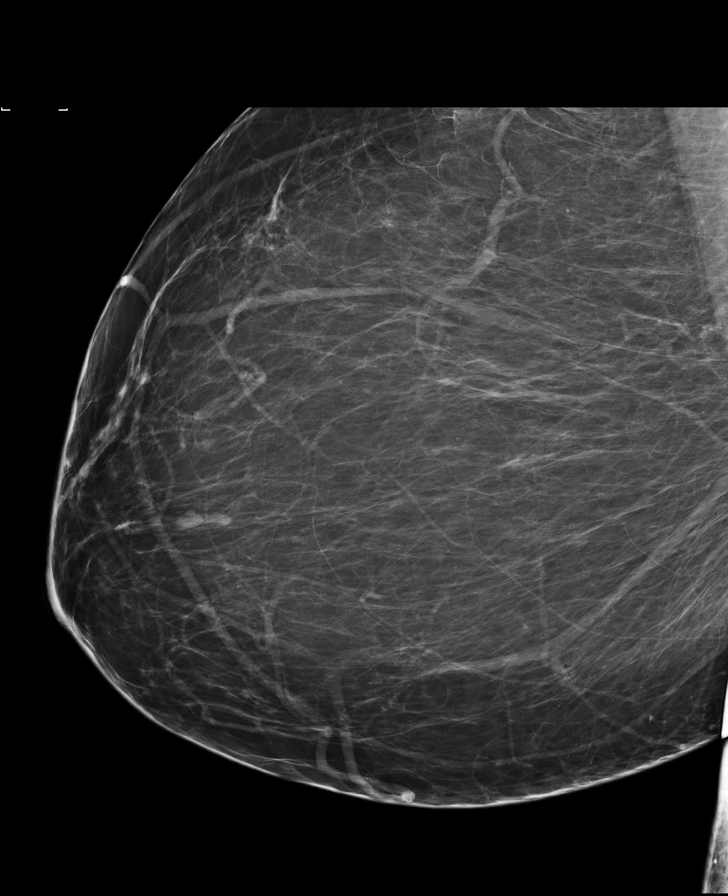

[R CC (1 of 3)]
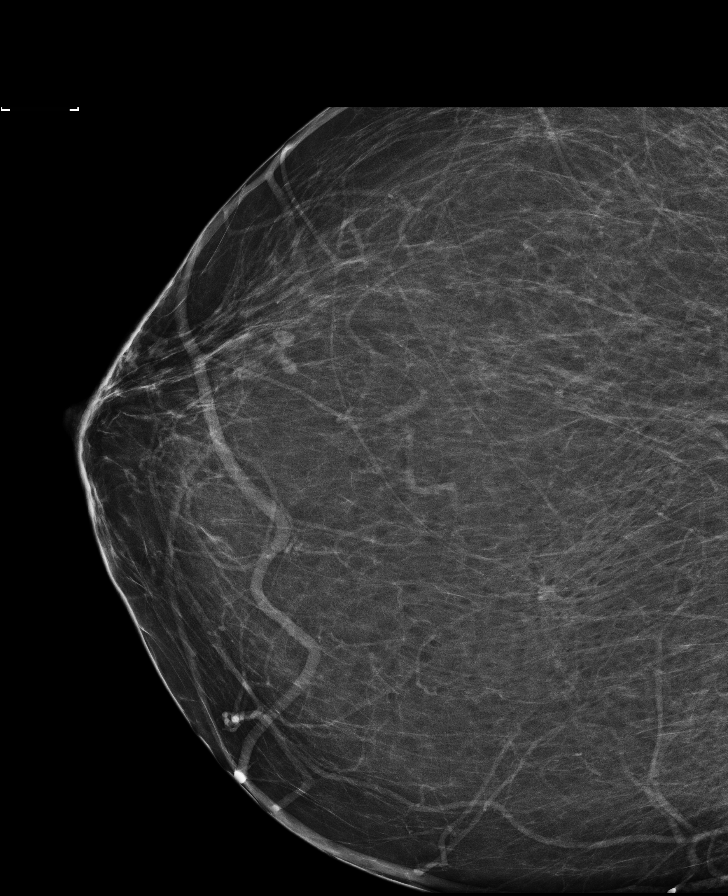

[R MLO (2 of 2)]
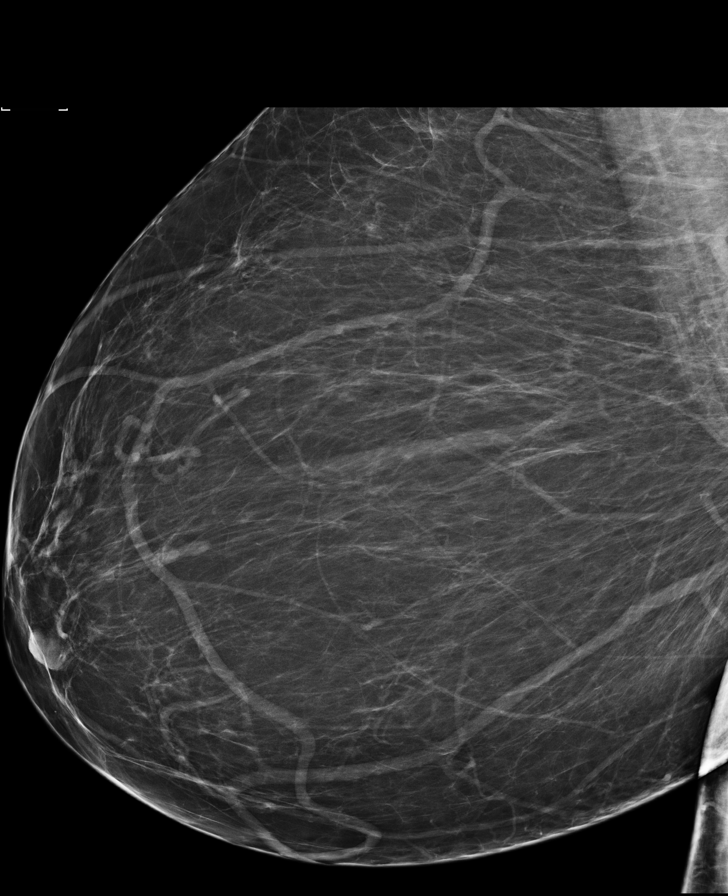

[R CC (2 of 3)]
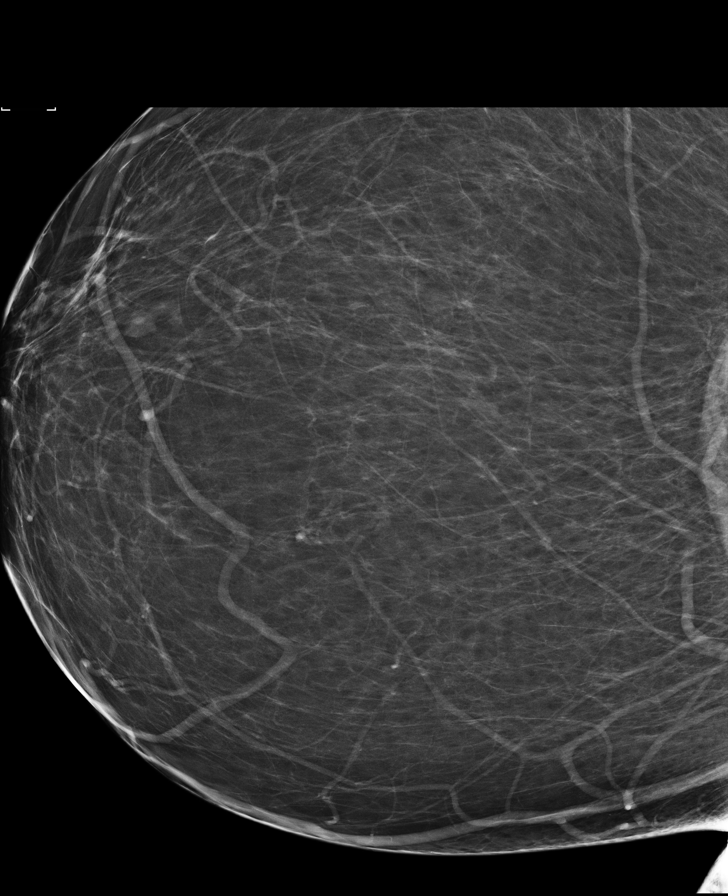

[L CC (1 of 2)]
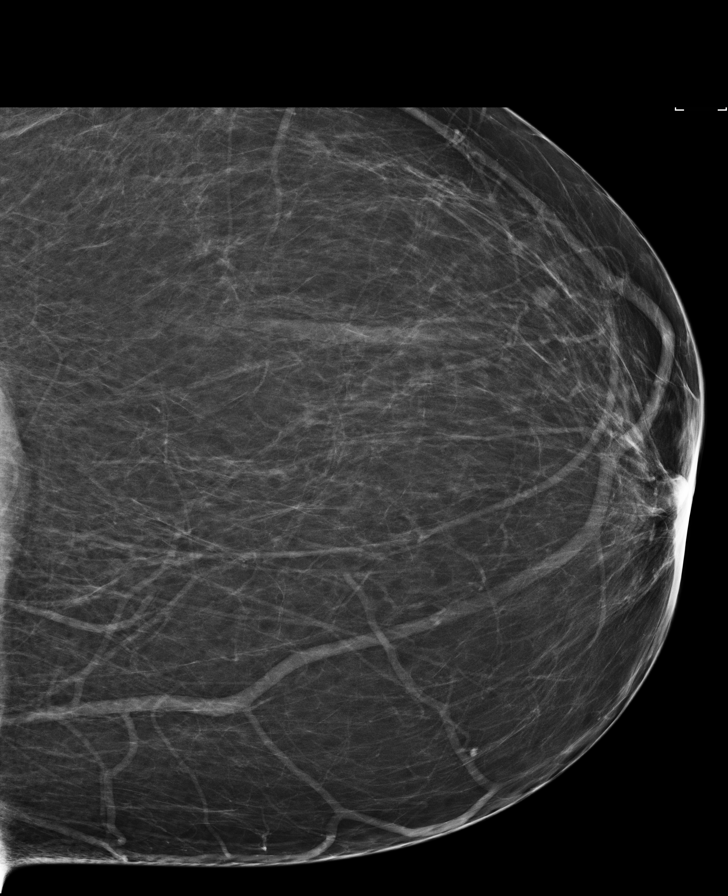

[L CC (2 of 2)]
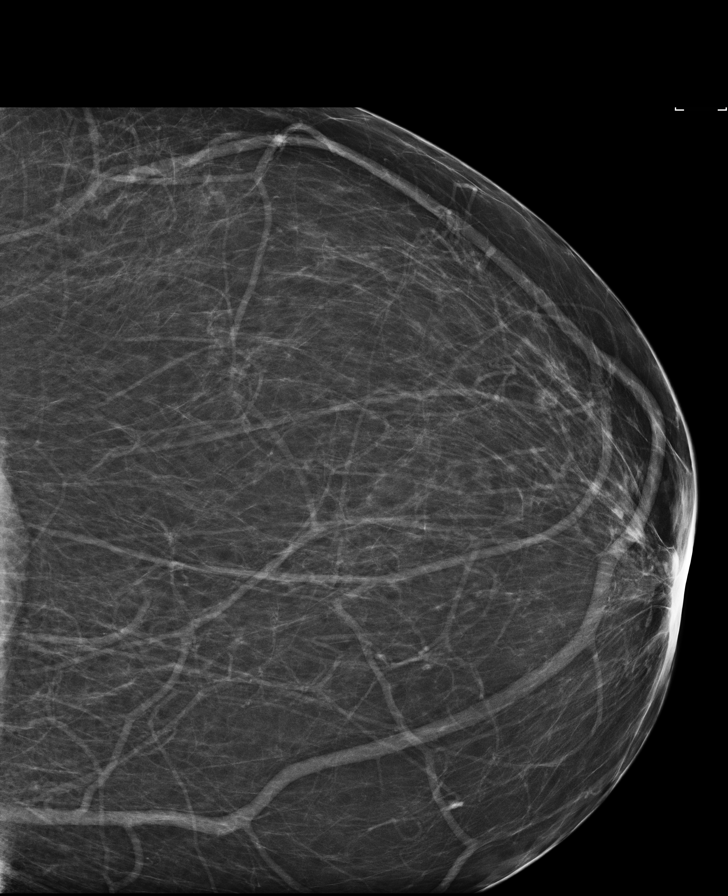

[L MLO]
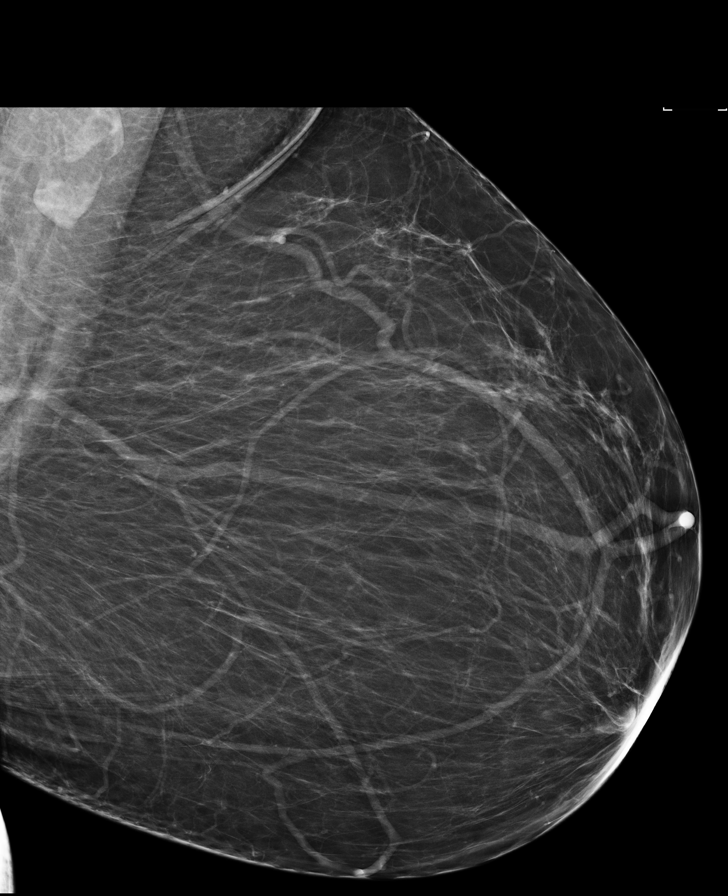

[R CC (3 of 3)]
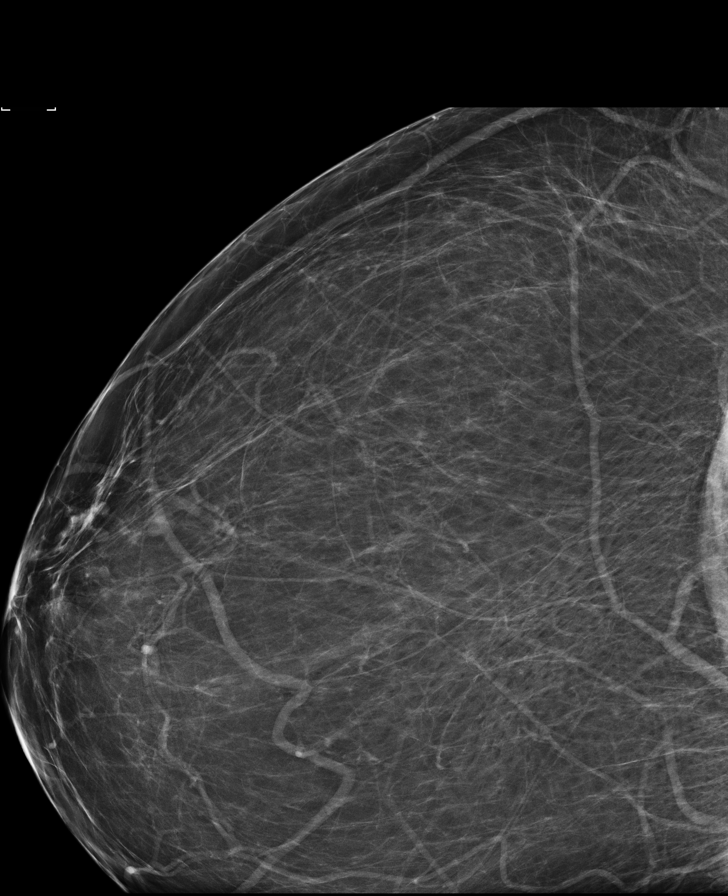

[8 of 8 positions shown; findings below may reference images not displayed]

FINDINGS: There are no findings suspicious for malignancy. Images were
processed with CAD.
IMPRESSION: No mammographic evidence of malignancy. A result letter of this
screening mammogram will be mailed directly to the patient.

RECOMMENDATION:
Screening mammogram in one year. (Code:[id number])

BI-RADS CATEGORY  1: Negative.

## 2020-12-16 ENCOUNTER — Encounter: Payer: Self-pay | Admitting: *Deleted

## 2020-12-22 ENCOUNTER — Ambulatory Visit: Payer: Medicaid Other | Admitting: Dietician

## 2021-02-24 ENCOUNTER — Telehealth: Payer: Self-pay | Admitting: *Deleted

## 2021-02-24 DIAGNOSIS — E119 Type 2 diabetes mellitus without complications: Secondary | ICD-10-CM

## 2021-02-24 MED ORDER — METFORMIN HCL ER (MOD) 1000 MG PO TB24: 1000.0000 mg | ORAL_TABLET | Freq: Two times a day (BID) | ORAL | 0 refills | Status: DC

## 2021-02-24 NOTE — Telephone Encounter (Signed)
Received refill request from pt's pharmacy for metformin er 500mg  tabs. Pt has both regular release and extended release on chart Call to pt She takes metformin er 500mg  2 pills with breakfast and 2 pills with supper "most days" Pt scheduled for f/u appt at the end of the month with the yellow team.   Will send refill request to appropriate team Of note, pt does not take dulaglutide (trulicity)

## 2021-03-02 ENCOUNTER — Other Ambulatory Visit: Payer: Self-pay | Admitting: Internal Medicine

## 2021-03-02 MED ORDER — METFORMIN HCL 500 MG PO TABS: 1000.0000 mg | ORAL_TABLET | Freq: Two times a day (BID) | ORAL | 0 refills | Status: DC

## 2021-03-02 NOTE — Telephone Encounter (Cosign Needed)
Patient called back. States Rx for metformin that was sent on 9/13 will cost $21,000. Patient requesting different Metformin XR 500 mg 2 tabs BID.

## 2021-03-02 NOTE — Telephone Encounter (Signed)
30d supply of Metformin sent to pharmacy.  Will provide further refills at her follow up visit with me later this month.

## 2021-03-04 ENCOUNTER — Other Ambulatory Visit: Payer: Self-pay | Admitting: Internal Medicine

## 2021-03-04 MED ORDER — METFORMIN HCL ER 500 MG PO TB24: 1000.0000 mg | ORAL_TABLET | Freq: Two times a day (BID) | ORAL | 0 refills | Status: DC

## 2021-03-04 NOTE — Addendum Note (Signed)
Addended by: Elige Radon on: 03/04/2021 03:02 PM   Modules accepted: Orders

## 2021-03-04 NOTE — Addendum Note (Signed)
Addended by: Maura Crandall on: 03/04/2021 02:42 PM   Modules accepted: Orders

## 2021-03-04 NOTE — Telephone Encounter (Addendum)
Call from pt Picked up rx for metformin 500mg  -take 1000mg  twice daily Had previously stated she gets the extended release and not happy with the standard release Has appt on 09/23 with Dr , but wishes to have rx sent into pharmacy today  Will send to MD for review.10/23 Cassady9/21/20222:42 PM

## 2021-03-06 ENCOUNTER — Other Ambulatory Visit (HOSPITAL_COMMUNITY): Payer: Self-pay

## 2021-03-06 ENCOUNTER — Encounter: Payer: Self-pay | Admitting: Internal Medicine

## 2021-03-06 ENCOUNTER — Ambulatory Visit (INDEPENDENT_AMBULATORY_CARE_PROVIDER_SITE_OTHER): Payer: Self-pay | Admitting: Internal Medicine

## 2021-03-06 VITALS — BP 151/90 | HR 90 | Temp 98.6°F | Ht 65.0 in | Wt 292.1 lb

## 2021-03-06 DIAGNOSIS — E119 Type 2 diabetes mellitus without complications: Secondary | ICD-10-CM

## 2021-03-06 DIAGNOSIS — Z23 Encounter for immunization: Secondary | ICD-10-CM

## 2021-03-06 DIAGNOSIS — R030 Elevated blood-pressure reading, without diagnosis of hypertension: Secondary | ICD-10-CM

## 2021-03-06 LAB — GLUCOSE, CAPILLARY: Glucose-Capillary: 295 mg/dL — ABNORMAL HIGH (ref 70–99)

## 2021-03-06 LAB — POCT GLYCOSYLATED HEMOGLOBIN (HGB A1C): Hemoglobin A1C: 9.6 % — AB (ref 4.0–5.6)

## 2021-03-06 MED ORDER — LIRAGLUTIDE 18 MG/3ML ~~LOC~~ SOPN
PEN_INJECTOR | SUBCUTANEOUS | 1 refills | Status: DC
  Filled 2021-03-06: qty 9, 30d supply, fill #0
  Filled 2021-04-06 – 2021-04-14 (×2): qty 9, 30d supply, fill #1
  Filled 2021-05-09: qty 6, 20d supply, fill #2

## 2021-03-06 MED ORDER — METFORMIN HCL ER 500 MG PO TB24
1000.0000 mg | ORAL_TABLET | Freq: Two times a day (BID) | ORAL | 4 refills | Status: DC
  Filled 2021-03-06: qty 120, 30d supply, fill #0
  Filled 2021-05-12: qty 120, 30d supply, fill #1
  Filled 2021-07-22: qty 120, 30d supply, fill #2

## 2021-03-06 MED ORDER — PEN NEEDLES 32G X 4 MM MISC
1.0000 | Freq: Every day | 1 refills | Status: DC
  Filled 2021-03-06: qty 100, 30d supply, fill #0
  Filled 2021-05-12: qty 100, 30d supply, fill #1

## 2021-03-06 MED ORDER — LIRAGLUTIDE 18 MG/3ML ~~LOC~~ SOPN: PEN_INJECTOR | SUBCUTANEOUS | 1 refills | Status: DC

## 2021-03-06 NOTE — Assessment & Plan Note (Signed)
She is working hard on weight loss. Weight is down 5# from June. She recently met with a personal trainer and is motivated to lose weight. We will get her started on Victoza for diabetes today which will hopefully also assist her with weight loss.

## 2021-03-06 NOTE — Assessment & Plan Note (Addendum)
She presents for follow up of uncontrolled type 2 diabetes.  Current medications: metformin xr 1000mg BID She was prescribed trulicity at her last office visit in June however reports that she wanted to work on life style changes before starting it. She notes that she tries to take the metformin twice daily however if she does not eat enough in the evenings, she will awaken with a headache, so she occasionally misses the evening dose.  Since her last office visit, she has become significantly motivated about lifestyle changes. She recently met with a personal trainer who is going to also help guide nutritional choices.  Her A1C is down to 9.6 today from 10.3. I stressed the importance of improving glycemic control. She was initially hesitant to start a second medication however, after explaining that a GLP-1 agonist may also help with weight loss, she is agreeable to starting this. Foot exam performed and normal within the past 12 months. Ophthalmology exam with retinopathy screening completed in 01/2021 which she reports was normal.  Plan  Continue metformin xr. Recommended she try taking the full dose in the morning to avoid issues in the evening  Start victoza 0.6mg daily x7d, followed by 1.2mg x7d, followed by 1.8mg daily therafter  Referral placed to clinical pharmacy for medication education  Follow up in 3 months  Urine microalbumin/creatinine due at that time 

## 2021-03-06 NOTE — Progress Notes (Signed)
Office Visit   Patient ID: Sheena Soto, female    DOB: 08-09-1975, 45 y.o.   MRN: [id number]   PCP: Farrel Gordon, DO   Subjective:  Sheena Soto is a 45 y.o. year old female who presents for follow up of diabetes and morbid obesity. Please refer to problem based charting for assessment and plan.    Objective:   BP (!) 151/90   Pulse 90   Temp 98.6 F (37 C) (Oral)   Ht 5' 5"  (1.651 m)   Wt 292 lb 1.6 oz (132.5 kg)   SpO2 99%   BMI 48.61 kg/m  BP Readings from Last 3 Encounters:  03/06/21 (!) 151/90  12/02/20 121/80  11/21/20 (!) 144/86   General: well appearing, no distress Psych: normal affect Assessment & Plan:   Problem List Items Addressed This Visit       Endocrine   Diabetes mellitus type II, non insulin dependent ([place]) - Primary (Chronic)    She presents for follow up of uncontrolled type 2 diabetes.  Current medications: metformin xr 1059m BID She was prescribed trulicity at her last office visit in June however reports that she wanted to work on life style changes before starting it. She notes that she tries to take the metformin twice daily however if she does not eat enough in the evenings, she will awaken with a headache, so she occasionally misses the evening dose.  Since her last office visit, she has become significantly motivated about lifestyle changes. She recently met with a personal trainer who is going to also help guide nutritional choices.  Her A1C is down to 9.6 today from 10.3. I stressed the importance of improving glycemic control. She was initially hesitant to start a second medication however, after explaining that a GLP-1 agonist may also help with weight loss, she is agreeable to starting this. Foot exam performed and normal within the past 12 months. Ophthalmology exam with retinopathy screening completed in 01/2021 which she reports was normal.  Plan Continue metformin xr. Recommended she try taking the full dose in the morning to  avoid issues in the evening Start victoza 0.6101mdaily x7d, followed by 1.29m9m7d, followed by 1.8mg23mily therafter Referral placed to clinical pharmacy for medication education Follow up in 3 months Urine microalbumin/creatinine due at that time      Relevant Medications   liraglutide (VICTOZA) 18 MG/3ML SOPN   metFORMIN (GLUCOPHAGE XR) 500 MG 24 hr tablet   Other Relevant Orders   POC Hbg A1C (Completed)   Amb Referral to Clinical Pharmacist     Other   Elevated blood pressure reading without diagnosis of hypertension    Blood pressure is elevated to 151/90 in the office today. It was normal at 121/80 back in June.   Recommend re-evaluating at her next office visit and consider starting ACEi if it remains elevated. Would consider delving further into her OSA history as well.      Morbid obesity (HCC)[place] She is working hard on weight loss. Weight is down 5# from June. She recently met with a personal trainer and is motivated to lose weight. We will get her started on Victoza for diabetes today which will hopefully also assist her with weight loss.       Relevant Medications   liraglutide (VICTOZA) 18 MG/3ML SOPN   metFORMIN (GLUCOPHAGE XR) 500 MG 24 hr tablet   Other Visit Diagnoses     Need for immunization against influenza  Relevant Orders   Flu Vaccine QUAD 6moIM (Fluarix, Fluzone & Alfiuria Quad PF) (Completed)        Return in about 3 months (around 06/05/2021) for Diabetes follow up with Dr. DMarlou Sa   Pt discussed with Dr. HForde Radon MD Internal Medicine Resident PGY-3 [facility] 03/06/2021 3:34 PM

## 2021-03-06 NOTE — Progress Notes (Signed)
Internal Medicine Clinic Attending  Case discussed with Dr. Christian  At the time of the visit.  We reviewed the resident's history and exam and pertinent patient test results.  I agree with the assessment, diagnosis, and plan of care documented in the resident's note.  

## 2021-03-06 NOTE — Assessment & Plan Note (Addendum)
Blood pressure is elevated to 151/90 in the office today. It was normal at 121/80 back in June.   Recommend re-evaluating at her next office visit and consider starting ACEi if it remains elevated. Would consider delving further into her OSA history as well.

## 2021-03-10 NOTE — Progress Notes (Deleted)
PCP:  Champ Mungo, DO Primary Cardiologist: None Electrophysiologist: Sherryl Manges, MD   Sheena Soto is a 45 y.o. female seen today for Sherryl Manges, MD for routine electrophysiology followup.  Since last being seen in our clinic the patient reports doing ***.  she denies chest pain, palpitations, dyspnea, PND, orthopnea, nausea, vomiting, dizziness, syncope, edema, weight gain, or early satiety.  Past Medical History:  Diagnosis Date   Diabetes mellitus type II, non insulin dependent (HCC) 03/27/2018   Past Surgical History:  Procedure Laterality Date   BREAST BIOPSY Left    BREAST EXCISIONAL BIOPSY Left    Pt stated she had mass removed, doctor not sure what it was   BREAST SURGERY     CESAREAN SECTION     HERNIA REPAIR      Current Outpatient Medications  Medication Sig Dispense Refill   Insulin Pen Needle (PEN NEEDLES) 32G X 4 MM MISC Use daily with Victoza. 390 each 1   liraglutide (VICTOZA) 18 MG/3ML SOPN Inject 0.6mg  daily for one week, followed by 1.2mg  daily for one week, followed by 1.8mg  daily thereafter. 12 mL 1   metFORMIN (GLUCOPHAGE XR) 500 MG 24 hr tablet Take 2 tablets (1,000 mg total) by mouth 2 (two) times daily with a meal. 120 tablet 4   Prenatal Vit-Fe Fumarate-FA (PREPLUS) 27-1 MG TABS Take 1 tablet by mouth daily. 30 tablet 12   No current facility-administered medications for this visit.    Allergies  Allergen Reactions   Naprosyn [Naproxen] Other (See Comments)    Flared up diverticulitis    Social History   Socioeconomic History   Marital status: Single    Spouse name: Not on file   Number of children: Not on file   Years of education: Not on file   Highest education level: Not on file  Occupational History   Not on file  Tobacco Use   Smoking status: Never   Smokeless tobacco: Never  Vaping Use   Vaping Use: Never used  Substance and Sexual Activity   Alcohol use: Not Currently   Drug use: Never   Sexual activity: Not  Currently    Birth control/protection: None  Other Topics Concern   Not on file  Social History Narrative   Currently moved to [facility] 6 months ago   Lives with daughter   Social Determinants of Health   Financial Resource Strain: Not on file  Food Insecurity: Not on file  Transportation Needs: Not on file  Physical Activity: Not on file  Stress: Not on file  Social Connections: Not on file  Intimate Partner Violence: Not on file     Review of Systems: All other systems reviewed and are otherwise negative except as noted above.  Physical Exam: There were no vitals filed for this visit.  GEN- The patient is well appearing, alert and oriented x 3 today.   HEENT: normocephalic, atraumatic; sclera clear, conjunctiva pink; hearing intact; oropharynx clear; neck supple, no JVP Lymph- no cervical lymphadenopathy Lungs- Clear to ausculation bilaterally, normal work of breathing.  No wheezes, rales, rhonchi Heart- Regular rate and rhythm, no murmurs, rubs or gallops, PMI not laterally displaced GI- soft, non-tender, non-distended, bowel sounds present, no hepatosplenomegaly Extremities- no clubbing, cyanosis, or edema; DP/PT/radial pulses 2+ bilaterally MS- no significant deformity or atrophy Skin- warm and dry, no rash or lesion Psych- euthymic mood, full affect Neuro- strength and sensation are intact  EKG is ordered. Personal review of EKG from today shows ***  Additional studies reviewed include: Previous EP office notes. ***  Assessment and Plan:  1. Palpitations  2. NSVT No palpitations or syncope  Graciella Freer, [place]  03/10/21 3:15 PM

## 2021-03-11 ENCOUNTER — Ambulatory Visit: Payer: Medicaid Other | Admitting: Student

## 2021-03-11 DIAGNOSIS — I472 Ventricular tachycardia: Secondary | ICD-10-CM

## 2021-03-25 ENCOUNTER — Encounter: Payer: Self-pay | Admitting: Internal Medicine

## 2021-03-27 ENCOUNTER — Telehealth: Payer: Self-pay | Admitting: *Deleted

## 2021-03-27 NOTE — Telephone Encounter (Signed)
Patient called in to say since starting Victoza her appetite has decreased. She lowered dose of metformin to 500 mg BID when she increased Victoza to 1.2 mg (~ 1 week ago). States AM fasting CBGs have been 79-90 on the 500 mg BID.

## 2021-04-02 ENCOUNTER — Ambulatory Visit: Payer: Medicaid Other | Admitting: Physician Assistant

## 2021-04-06 ENCOUNTER — Other Ambulatory Visit (HOSPITAL_COMMUNITY): Payer: Self-pay

## 2021-04-12 NOTE — Progress Notes (Signed)
Cardiology Office Note Date:  04/14/2021  Patient ID:  Sheena Soto, DOB 1976-04-23, MRN [id number] PCP:  Champ Mungo, DO  Cardiologist:  Dr. Eldridge Dace Electrophysiologist: Dr. Graciela Husbands    Chief Complaint:  annual visit  History of Present Illness: Sheena Soto is a 45 y.o. female with history of DM, morbid obesity, OSA, does not use CPAP,  NSVT  Palpitations with H/o NS-VT - with structurally normal heart and normal SA ECG.    ? FHx of sudden death (single vehicle MVA)  - cMRI 12/2018 with normal LV and RVEF with no LGE. No evidence of ARVC.  She comes in today to be seen for Dr. Graciela Husbands and A. [place], [place],  last seen by him July 2020.  At that time she was getting ready to start college and so was her daughter. No changes were made, she was pending a sleep study and encouraged weight loss  Telephone notes in Sept report recent increase in palpitations, ?reduced O2 sats, she thinks perhaps connected to a problem with her stove?  She had a GYN visit that day with stable VS.  I saw her Oct 2021 She is doing very well. She is exercising, dieting and actively losing weight.  She has a trainer that sends her workouts to complete and this has been very good for her. 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 She has a new stove and hs not had that opressive feeling since then,. She explains that she rarely cooks at home, and had not used the stove in months and when she put the oven on, eventually developed an unusual feeling of SOB, went to the kitchen and became aware of a unusual fume/odor cut off the stove and went outside and sat in her car with the a/c.  She almost immediately felt ebtter and eventually her HR and O2 levels evened out. Out sideof that she has had no cardiac awareness, no dizzy spells, near syncope or syncope. She has occassional sharp fleeting CP as well as chest wall  tenderness that she feels is 2/2 to her large breasts and chest wall aching. Since losing weight she no longer wakes though does still snore. No SOB, feels like she has good exertional capacity with her exercising and ADLs No changes, encouraged to continue weight loss and revisit sleep study/apnea.  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 She is on the go constantly without SOB She sings and has goo exertional capacity and lung capacity No near syncope or cynaope No overt palpitations, sometimes she notes by  her technology her HR up sometimes 90's-or so.  She has found that she is so tired at the end of the days that she has been falling asleep on the couch and waking there as well, and now thinking about it, thinks some of her aches/pains are because of this.    Past Medical History:  Diagnosis Date   Diabetes mellitus type II, non insulin dependent (HCC) 03/27/2018    Past Surgical History:  Procedure Laterality  Date   BREAST BIOPSY Left    BREAST EXCISIONAL BIOPSY Left    Pt stated she had mass removed, doctor not sure what it was   BREAST SURGERY     CESAREAN SECTION     HERNIA REPAIR      Current Outpatient Medications  Medication Sig Dispense Refill   Insulin Pen Needle (PEN NEEDLES) 32G X 4 MM MISC Use daily with Victoza. 390 each 1   liraglutide (VICTOZA) 18 MG/3ML SOPN Inject 0.6mg  daily for one week, followed by 1.2mg  daily for one week, followed by 1.8mg  daily thereafter. 12 mL 1   metFORMIN (GLUCOPHAGE XR) 500 MG 24 hr tablet Take 2 tablets (1,000 mg total) by mouth 2 (two) times daily with a meal. 120  tablet 4   Prenatal Vit-Fe Fumarate-FA (PREPLUS) 27-1 MG TABS Take 1 tablet by mouth daily. 30 tablet 12   No current facility-administered medications for this visit.    Allergies:   Naprosyn [naproxen]   Social History:  The patient  reports that she has never smoked. She has never used smokeless tobacco. She reports that she does not currently use alcohol. She reports that she does not use drugs.   Family History:  The patient's family history includes Atrial fibrillation in her mother; Diabetes in her maternal grandmother and mother; Fibroids in her mother; Heart attack (age of onset: 1) in her maternal uncle; Hypertension in her maternal grandmother and mother.  ROS:  Please see the history of present illness.    All other systems are reviewed and otherwise negative.   PHYSICAL EXAM:  VS:  BP 120/80   Pulse 90   Ht 5\' 5"  (1.651 m)   Wt 288 lb 6.4 oz (130.8 kg)   SpO2 97%   BMI 47.99 kg/m  BMI: Body mass index is 47.99 kg/m. Well nourished, well developed, in no acute distress HEENT: normocephalic, atraumatic Neck: no JVD, carotid bruits or masses Cardiac:  RRR; no significant murmurs, no rubs, or gallops Lungs:  CTA b/l, no wheezing, rhonchi or rales Abd: soft, nontender MS: no deformity or atrophy Ext:  no edema Skin: warm and dry, no rash Neuro:  No gross deficits appreciated Psych: euthymic mood, full affect   EKG:  Done today and reviewed by myself shows  SR 90bm, no changes  12/22/2018: c.MRI IMPRESSION: 1.  Normal LV size and systolic function, EF 62%. 2. Normal RV size and systolic function, EF 53%. No evidence for ARVC. 3. No myocardial LGE, so no definitive evidence for prior MI, infiltrative disease or myocarditis.   08/16/2018: TTE IMPRESSIONS  1. The left ventricle has normal systolic function with an ejection  fraction of 60-65%. The cavity size was mildly dilated. Left ventricular  diastolic parameters were normal.   2. The right ventricle has  normal systolic function. The cavity was  normal. There is no increase in right ventricular wall thickness.   3. Left atrial size was mildly dilated.   4. The mitral valve is degenerative. Mild thickening of the mitral valve  leaflet. Mild calcification of the mitral valve leaflet. There is mild  mitral annular calcification present.   5. The tricuspid valve is normal in structure.   6. The aortic valve is tricuspid Mild sclerosis of the aortic valve.   7. The pulmonic valve was normal in structure.    07/27/2018: 30 day monitor Normal sinus rhythm. One episode of 13 beats of nonsustained ventricular tachycardia. No symptoms noted.    Recent Labs: No  results found for requested labs within last 8760 hours.  No results found for requested labs within last 8760 hours.   CrCl cannot be calculated (Patient's most recent lab result is older than the maximum 21 days allowed.).   Wt Readings from Last 3 Encounters:  04/14/21 288 lb 6.4 oz (130.8 kg)  03/06/21 292 lb 1.6 oz (132.5 kg)  12/02/20 297 lb 12.8 oz (135.1 kg)     Other studies reviewed: Additional studies/records reviewed today include: summarized above  ASSESSMENT AND PLAN:  1. Palpitations 2. NSVT     No palpitations or syncope  3. CP Atypical  Discussed cardiac health and healthy lifestyle, labs, lipids are with her PMD  Disposition: we will continue to see her annually, sooner if needed   Current medicines are reviewed at length with the patient today.  The patient did not have any concerns regarding medicines.  Norma Fredrickson, PA-C 04/14/2021 2:53 PM     [facility] [address] [place] [address] [phone] (office)  [phone] (fax)

## 2021-04-14 ENCOUNTER — Other Ambulatory Visit (HOSPITAL_COMMUNITY): Payer: Self-pay

## 2021-04-14 ENCOUNTER — Encounter: Payer: Self-pay | Admitting: Physician Assistant

## 2021-04-14 ENCOUNTER — Ambulatory Visit (INDEPENDENT_AMBULATORY_CARE_PROVIDER_SITE_OTHER): Payer: Self-pay | Admitting: Physician Assistant

## 2021-04-14 ENCOUNTER — Other Ambulatory Visit: Payer: Self-pay

## 2021-04-14 VITALS — BP 120/80 | HR 90 | Ht 65.0 in | Wt 288.4 lb

## 2021-04-14 DIAGNOSIS — I4729 Other ventricular tachycardia: Secondary | ICD-10-CM

## 2021-04-14 NOTE — Patient Instructions (Signed)
Medication Instructions:   Your physician recommends that you continue on your current medications as directed. Please refer to the Current Medication list given to you today.  *If you need a refill on your cardiac medications before your next appointment, please call your pharmacy*   Lab Work: NONE ORDERED  TODAY   If you have labs (blood work) drawn today and your tests are completely normal, you will receive your results only by: MyChart Message (if you have MyChart) OR A paper copy in the mail If you have any lab test that is abnormal or we need to change your treatment, we will call you to review the results.   Testing/Procedures: NONE ORDERED  TODAY    Follow-Up: At [facility], you and your health needs are our priority.  As part of our continuing mission to provide you with exceptional heart care, we have created designated Provider Care Teams.  These Care Teams include your primary Cardiologist (physician) and Advanced Practice Providers (APPs -  Physician Assistants and Nurse Practitioners) who all work together to provide you with the care you need, when you need it.  We recommend signing up for the patient portal called "MyChart".  Sign up information is provided on this After Visit Summary.  MyChart is used to connect with patients for Virtual Visits (Telemedicine).  Patients are able to view lab/test results, encounter notes, upcoming appointments, etc.  Non-urgent messages can be sent to your provider as well.   To learn more about what you can do with MyChart, go to [email or web address].    Your next appointment:   1 year(s)  The format for your next appointment:   In Person  Provider:   You may see Sherryl Manges, MD or one of the following Advanced Practice Providers on your designated Care Team:   Francis Dowse, [place] Casimiro Needle "Mardelle Matte" Lanna Poche, [place]   Other Instructions

## 2021-04-16 NOTE — Telephone Encounter (Signed)
Appointment scheduled for 04/27/2021 at 9 am and mailed to patient.

## 2021-04-20 ENCOUNTER — Other Ambulatory Visit: Payer: Self-pay | Admitting: Internal Medicine

## 2021-04-20 DIAGNOSIS — Z1231 Encounter for screening mammogram for malignant neoplasm of breast: Secondary | ICD-10-CM

## 2021-04-24 ENCOUNTER — Ambulatory Visit: Payer: Medicaid Other

## 2021-04-27 ENCOUNTER — Encounter: Payer: Medicaid Other | Admitting: Pharmacist

## 2021-05-11 ENCOUNTER — Other Ambulatory Visit (HOSPITAL_COMMUNITY): Payer: Self-pay

## 2021-05-12 ENCOUNTER — Other Ambulatory Visit (HOSPITAL_COMMUNITY): Payer: Self-pay

## 2021-05-13 ENCOUNTER — Other Ambulatory Visit (HOSPITAL_COMMUNITY): Payer: Self-pay

## 2021-05-18 ENCOUNTER — Other Ambulatory Visit: Payer: Self-pay | Admitting: *Deleted

## 2021-05-18 ENCOUNTER — Ambulatory Visit (INDEPENDENT_AMBULATORY_CARE_PROVIDER_SITE_OTHER): Payer: Self-pay | Admitting: Pharmacist

## 2021-05-18 DIAGNOSIS — E119 Type 2 diabetes mellitus without complications: Secondary | ICD-10-CM

## 2021-05-18 NOTE — Telephone Encounter (Signed)
My Chart message - Dr. Please send another prescription for Victim's I just picked up the last of my 1st prescription.  Called pt to clarify request - requesting a refill on Victoza.

## 2021-05-18 NOTE — Progress Notes (Signed)
Subjective:    Patient ID: Sheena Soto, female    DOB: 09/05/1975, 45 y.o.   MRN: [id number]  HPI Patient is a 45 y.o. female who presents for diabetes management. She is in good spirits and presents without assistance. Patient was referred and last seen by provider, Dr. Ephriam Knuckles, on 03/06/21.  Patient reports diabetes was diagnosed in June 2022.   Insurance coverage/medication affordability: [employer]  Current diabetes medications include: Victoza 1.8mg  once daily, metformin XR 500mg  1 tablet in the morning and 1-2 tablets in the evening Patient states that She is not taking her medications as prescribed. She is taking less metformin due to tolerability issues. Patient reports adherence with medications. Patient states that She does not miss doses of her medications.  Do you feel that your medications are working for you?  yes  Have you been experiencing any side effects to the medications prescribed? Yes; diarrhea with metformin  Do you have any problems obtaining medications due to transportation or finances?  no     Patient reported dietary habits:  Eats 2-3 meals/day and 1 snacks/day Breakfast: protein smoothies (T/Th) and sometimes skips Lunch: sandwich  Dinner: protein smoothies (T/Th), protein, starch, veggies Drinks: water Patient reports she previously struggled with late night snacking but since starting Victoza her appetite has decreased and she is eating smaller portions. She states on occasion she will get a sweet tooth and eat chocolate or a cupcake but that this does not occur very often  Patient-reported exercise habits: Personal trainer and has home exercises   Patient reports hypoglycemic events in 70's Patient denies polyuria (increased urination).  Patient denies polyphagia (increased appetite).  Patient denies polydipsia (increased thirst).  Patient reports neuropathy (nerve pain) in toes Patient reports visual changes but had eye appt recently and  reports vision was good Patient reports self foot exams.   Objective:   30 day blood glucose average: 106 Home fasting blood sugars:  135, 116, 97 2 hour post-meal/random blood sugars: 104, 127, 109, 106, 101, 120, 93  Labs:   Physical Exam Constitutional:      Appearance: She is obese.  Neurological:     Mental Status: She is alert and oriented to person, place, and time.    Review of Systems  Gastrointestinal:  Positive for diarrhea. Negative for abdominal pain, nausea and vomiting.   Lab Results  Component Value Date   HGBA1C 9.6 (A) 03/06/2021   HGBA1C 10.3 (A) 12/02/2020   HGBA1C 7.0 (A) 02/25/2020   Vitals:   05/18/21 1117  Weight: 278 lb 8 oz (126.3 kg)    Lab Results  Component Value Date   MICRALBCREAT 26 02/25/2020    Lipid Panel     Component Value Date/Time   CHOL 140 12/19/2019 1057   TRIG 109 12/19/2019 1057   HDL 32 (L) 12/19/2019 1057   CHOLHDL 4.4 12/19/2019 1057   CHOLHDL 4.2 02/22/2018 1227   VLDL 27 02/22/2018 1227   LDLCALC 88 12/19/2019 1057    Clinical Atherosclerotic Cardiovascular Disease (ASCVD): No  The 10-year ASCVD risk score (Arnett DK, et al., 2019) is: 3.5%   Values used to calculate the score:     Age: 26 years     Sex: Female     Is Non-Hispanic African American: Yes     Diabetic: Yes     Tobacco smoker: No     Systolic Blood Pressure: 120 mmHg     Is BP treated: No     HDL Cholesterol:  32 mg/dL     Total Cholesterol: 140 mg/dL   Assessment/Plan:   T2IZ is controlled based on glucometer readings. Medication adherence appears optimal. Discussed with patient option to switch from Victoza 1.8mg  max dose to [facility] for further weight loss benefits, but patient stated now is not a good time for medication changes and she would like to re-visit this discussion after the new year. Instructed patient to decrease metformin to 1 tablet in the morning and 2 tablets in the evening and if still intolerable can decrease down to 1  tablet BID. At next follow-up visit if patient only taking 1 tablet BID will then consider increasing dosage from 500mg  XR to 750mg  XR. Following instruction patient verbalized understanding of treatment plan.    Continued GLP-1 Victoza 1.8mg  once daily Decreased dose of metformin 500mg  XR to 1 tablet in the AM and 2 tablets in the PM Extensively discussed pathophysiology of diabetes, dietary effects on blood sugar control, and recommended lifestyle interventions. Counseled on s/sx of and management of hypoglycemia Next A1C anticipated at next office visit.   Follow-up appointment 5 weeks to review sugar readings. Written patient instructions provided.  This appointment required 30 minutes of direct patient care.  Thank you for involving pharmacy to assist in providing this patient's care.

## 2021-05-18 NOTE — Patient Instructions (Signed)
Sheena Soto it was a pleasure seeing you today.   Please do the following:  Continue your current medications as directed today during your appointment. If you have any questions or if you believe something has occurred because of this change, call me or your doctor to let one of [place] know.  Continue checking blood sugars at home. It's really important that you record these and bring these in to your next doctor's appointment.  Continue making the lifestyle changes we've discussed together during our visit. Diet and exercise play a significant role in improving your blood sugars.  Follow-up with me in five weeks  Hypoglycemia or low blood sugar:   Low blood sugar can happen quickly and may become an emergency if not treated right away.   While this shouldn't happen often, it can be brought upon if you skip a meal or do not eat enough. Also, if your insulin or other diabetes medications are dosed too high, this can cause your blood sugar to go to low.   Warning signs of low blood sugar include: Feeling shaky or dizzy Feeling weak or tired  Excessive hunger Feeling anxious or upset  Sweating even when you aren't exercising  What to do if I experience low blood sugar? Follow the Rule of 15 Check your blood sugar with your meter. If lower than 70, proceed to step 2.  Treat with 15 grams of fast acting carbs which is found in 3-4 glucose tablets. If none are available you can try hard candy, 1 tablespoon of sugar or honey,4 ounces of fruit juice, or 6 ounces of REGULAR soda.  Re-check your sugar in 15 minutes. If it is still below 70, do what you did in step 2 again. If your blood sugar has come back up, go ahead and eat a snack or small meal made up of complex carbs (ex. Whole grains) and protein at this time to avoid recurrence of low blood sugar.

## 2021-05-18 NOTE — Assessment & Plan Note (Signed)
  T2DM is controlled based on glucometer readings. Medication adherence appears optimal. Discussed with patient option to switch from Victoza 1.8mg  max dose to [facility] for further weight loss benefits, but patient stated now is not a good time for medication changes and she would like to re-visit this discussion after the new year. Instructed patient to decrease metformin to 1 tablet in the morning and 2 tablets in the evening and if still intolerable can decrease down to 1 tablet BID. At next follow-up visit if patient only taking 1 tablet BID will then consider increasing dosage from 500mg  XR to 750mg  XR. Following instruction patient verbalized understanding of treatment plan.    1. Continued GLP-1 Victoza 1.8mg  once daily 2. Decreased dose of metformin 500mg  XR to 1 tablet in the AM and 2 tablets in the PM 3. Extensively discussed pathophysiology of diabetes, dietary effects on blood sugar control, and recommended lifestyle interventions. 4. Counseled on s/sx of and management of hypoglycemia 5. Next A1C anticipated at next office visit.   Follow-up appointment 5 weeks to review sugar readings.

## 2021-05-20 ENCOUNTER — Other Ambulatory Visit (HOSPITAL_COMMUNITY): Payer: Self-pay

## 2021-05-20 MED ORDER — LIRAGLUTIDE 18 MG/3ML ~~LOC~~ SOPN
PEN_INJECTOR | SUBCUTANEOUS | 1 refills | Status: DC
  Filled 2021-05-20: qty 12, fill #0
  Filled 2021-06-08: qty 9, 28d supply, fill #0
  Filled 2021-06-16: qty 9, 28d supply, fill #1

## 2021-06-08 ENCOUNTER — Other Ambulatory Visit (HOSPITAL_COMMUNITY): Payer: Self-pay

## 2021-06-09 ENCOUNTER — Other Ambulatory Visit (HOSPITAL_COMMUNITY): Payer: Self-pay

## 2021-06-16 ENCOUNTER — Other Ambulatory Visit (HOSPITAL_COMMUNITY): Payer: Self-pay

## 2021-06-25 ENCOUNTER — Other Ambulatory Visit (HOSPITAL_COMMUNITY): Payer: Self-pay

## 2021-06-25 ENCOUNTER — Ambulatory Visit (INDEPENDENT_AMBULATORY_CARE_PROVIDER_SITE_OTHER): Payer: Self-pay | Admitting: Pharmacist

## 2021-06-25 DIAGNOSIS — E119 Type 2 diabetes mellitus without complications: Secondary | ICD-10-CM

## 2021-06-25 LAB — POCT GLYCOSYLATED HEMOGLOBIN (HGB A1C): Hemoglobin A1C: 6.4 % — AB (ref 4.0–5.6)

## 2021-06-25 LAB — GLUCOSE, CAPILLARY: Glucose-Capillary: 103 mg/dL — ABNORMAL HIGH (ref 70–99)

## 2021-06-25 MED ORDER — MOUNJARO 7.5 MG/0.5ML ~~LOC~~ SOAJ
7.5000 mg | SUBCUTANEOUS | 0 refills | Status: DC
  Filled 2021-06-25 – 2021-07-22 (×4): qty 2, 28d supply, fill #0

## 2021-06-25 NOTE — Progress Notes (Signed)
Subjective:    Patient ID: Sheena Soto, female    DOB: 06/24/1975, 46 y.o.   MRN: [id number]  HPI Patient is a 46 y.o. female who presents for diabetes management. She is in good spirits and presents without assistance. Patient was referred and last seen by provider, Dr. Ephriam Knuckles, on 03/06/21. Last seen in pharmacy clinic on 05/18/21.  Patient reports diabetes was diagnosed in June 2022.   Insurance coverage/medication affordability: [employer]  Current diabetes medications include: Victoza 1.8mg  once daily, metformin XR 500mg  1 tablet in the morning and 2 tablets in the evening Patient states that She is not taking her medications as prescribed. She is taking less metformin due to tolerability issues. Patient reports adherence with medications. Patient states that She does not miss doses of her medications.  Do you feel that your medications are working for you?  yes  Have you been experiencing any side effects to the medications prescribed? no  Do you have any problems obtaining medications due to transportation or finances?  no    Patient reported dietary habits:  Eats 2-3 meals/day and 1 snacks/day Breakfast: protein smoothies (T/Th) and sometimes skips Lunch: sandwich  Dinner: protein smoothies (T/Th), protein, starch, veggies Drinks: water Patient reports she previously struggled with late night snacking but since starting Victoza her appetite has decreased and she is eating smaller portions. 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  Patient denies polyphagia (increased appetite).  Patient denies polydipsia (increased thirst).  Patient reports neuropathy (nerve pain) in toes Patient reports visual changes but had eye appt  recently and reports vision was good Patient reports self foot exams.   Objective:   14 day blood glucose average: 101  Labs:   Physical Exam Constitutional:      Appearance: She is obese.  Neurological:     Mental Status: She is alert and oriented to person, place, and time.    Review of Systems  Gastrointestinal:  Positive for diarrhea. Negative for abdominal pain, nausea and vomiting.   Lab Results  Component Value Date   HGBA1C 6.4 (A) 06/25/2021   HGBA1C 9.6 (A) 03/06/2021   HGBA1C 10.3 (A) 12/02/2020   There were no vitals filed for this visit.   Lab Results  Component Value Date   MICRALBCREAT 26 02/25/2020    Lipid Panel     Component Value Date/Time   CHOL 140 12/19/2019 1057   TRIG 109 12/19/2019 1057   HDL 32 (L) 12/19/2019 1057   CHOLHDL 4.4 12/19/2019 1057   CHOLHDL 4.2 02/22/2018 1227   VLDL 27 02/22/2018 1227   LDLCALC 88 12/19/2019 1057    Clinical Atherosclerotic Cardiovascular Disease (ASCVD): No  The 10-year ASCVD risk score (Arnett DK, et al., 2019) is: 3.5%   Values used to calculate the score:     Age: 75 years     Sex: Female     Is Non-Hispanic African American: Yes     Diabetic: Yes     Tobacco smoker: No     Systolic Blood Pressure: 120 mmHg     Is BP treated: No     HDL Cholesterol: 32 mg/dL     Total Cholesterol: 140 mg/dL   Assessment/Plan:   59 is controlled likely due to optimal lifestyle  modifications. Medication adherence appears optimal. Previously discussed with patient option to switch from Victoza 1.8mg  max dose to [facility] for further weight loss benefits, and patient now willing to try. Will discontinue max dose Victoza and initiate Mounjaro 7.5mg  once weekly. Patient educated on purpose, proper use and potential adverse effects of Mounjaro.  Following instruction patient verbalized understanding of treatment plan.   Discontinued GLP-1 Victoza 1.8mg  once daily and started Mounjaro 7.5mg  once weekly Continued  metformin 500mg  XR 1 tablet in the AM and 2 tablets in the PM Extensively discussed pathophysiology of diabetes, dietary effects on blood sugar control, and recommended lifestyle interventions. Counseled on s/sx of and management of hypoglycemia Next A1C anticipated April 2023  Follow-up appointment 4 weeks to review sugar readings. Written patient instructions provided.  This appointment required 30 minutes of direct patient care.  Thank you for involving pharmacy to assist in providing this patient's care.

## 2021-06-25 NOTE — Patient Instructions (Signed)
Sheena Soto it was a pleasure seeing you today.   Please do the following:  Switch to [facility] 7.5mg  once weekly as directed today during your appointment. If you have any questions or if you believe something has occurred because of this change, call me or your doctor to let one of [place] know.  Continue checking blood sugars at home. It's really important that you record these and bring these in to your next doctor's appointment.  Continue making the lifestyle changes we've discussed together during our visit. Diet and exercise play a significant role in improving your blood sugars.  Follow-up with me in four weeks   Hypoglycemia or low blood sugar:   Low blood sugar can happen quickly and may become an emergency if not treated right away.   While this shouldn't happen often, it can be brought upon if you skip a meal or do not eat enough. Also, if your insulin or other diabetes medications are dosed too high, this can cause your blood sugar to go to low.   Warning signs of low blood sugar include: Feeling shaky or dizzy Feeling weak or tired  Excessive hunger Feeling anxious or upset  Sweating even when you aren't exercising  What to do if I experience low blood sugar? Follow the Rule of 15 Check your blood sugar with your meter. If lower than 70, proceed to step 2.  Treat with 15 grams of fast acting carbs which is found in 3-4 glucose tablets. If none are available you can try hard candy, 1 tablespoon of sugar or honey,4 ounces of fruit juice, or 6 ounces of REGULAR soda.  Re-check your sugar in 15 minutes. If it is still below 70, do what you did in step 2 again. If your blood sugar has come back up, go ahead and eat a snack or small meal made up of complex carbs (ex. Whole grains) and protein at this time to avoid recurrence of low blood sugar.

## 2021-07-06 ENCOUNTER — Other Ambulatory Visit (HOSPITAL_COMMUNITY): Payer: Self-pay

## 2021-07-08 NOTE — Assessment & Plan Note (Signed)
T2DM is controlled likely due to optimal lifestyle modifications. Medication adherence appears optimal. Previously discussed with patient option to switch from Victoza 1.8mg  max dose to [facility] for further weight loss benefits, and patient now willing to try. Will discontinue max dose Victoza and initiate Mounjaro 7.5mg  once weekly. Patient educated on purpose, proper use and potential adverse effects of Mounjaro.  Following instruction patient verbalized understanding of treatment plan.   1. Discontinued GLP-1 Victoza 1.8mg  once daily and started Mounjaro 7.5mg  once weekly 2. Continued metformin 500mg  XR 1 tablet in the AM and 2 tablets in the PM 3. Extensively discussed pathophysiology of diabetes, dietary effects on blood sugar control, and recommended lifestyle interventions. 4. Counseled on s/sx of and management of hypoglycemia 5. Next A1C anticipated April 2023  Follow-up appointment 4 weeks to review sugar readings.

## 2021-07-10 ENCOUNTER — Other Ambulatory Visit (HOSPITAL_COMMUNITY): Payer: Self-pay

## 2021-07-13 ENCOUNTER — Telehealth: Payer: Self-pay

## 2021-07-13 NOTE — Telephone Encounter (Signed)
Pa for pt ( MOUNJARO7.5MG /0.5 ML PEN INJECTOR) came through cover my meds was submitted with office notes and labs 1/12.Marland Kitchen Awaiting approval or denial

## 2021-07-14 NOTE — Telephone Encounter (Signed)
DECISION:    N/Atoday  PA Case: [id number], Status: Closed,   Closed Reason Code: FM Product not covered by this plan.    Prior Authorization not available.,   Closed Rationale: [employer] Prior Authorization team is unable to review this product for a coverage determination as the requested medication is not on the patient's formulary.   ( COPY PLACED TO SCAN TO CHART )

## 2021-07-17 ENCOUNTER — Other Ambulatory Visit (HOSPITAL_COMMUNITY): Payer: Self-pay

## 2021-07-22 ENCOUNTER — Other Ambulatory Visit (HOSPITAL_COMMUNITY): Payer: Self-pay

## 2021-07-23 ENCOUNTER — Ambulatory Visit (INDEPENDENT_AMBULATORY_CARE_PROVIDER_SITE_OTHER): Payer: Self-pay | Admitting: Pharmacist

## 2021-07-23 ENCOUNTER — Telehealth: Payer: Self-pay

## 2021-07-23 ENCOUNTER — Other Ambulatory Visit (HOSPITAL_COMMUNITY): Payer: Self-pay

## 2021-07-23 DIAGNOSIS — E119 Type 2 diabetes mellitus without complications: Secondary | ICD-10-CM

## 2021-07-23 MED ORDER — OZEMPIC (1 MG/DOSE) 4 MG/3ML ~~LOC~~ SOPN
1.0000 mg | PEN_INJECTOR | SUBCUTANEOUS | 0 refills | Status: DC
Start: 2021-07-23 — End: 2021-08-14
  Filled 2021-07-23 (×2): qty 3, 28d supply, fill #0

## 2021-07-23 NOTE — Progress Notes (Signed)
Subjective:    Patient ID: Sheena Soto, female    DOB: [birth year], 46 y.o.   MRN: [id number]  HPI Patient is a 46 y.o. female who presents for diabetes management. She is in good spirits and presents via telephone. Patient was referred and last seen by provider, Dr. Ephriam Knuckles, on 03/06/21. Last seen in pharmacy clinic on 07/06/21.  I connected with Sheena Soto on 07/23/21 at  1:30 PM EST by telephone and verified that I am speaking with the correct person using two identifiers.  Location: Patient: home Provider: [facility] clinic   I discussed the limitations, risks, security and privacy concerns of performing an evaluation and management service by telephone and the availability of in person appointments. I also discussed with the patient that there may be a patient responsible charge related to this service. The patient expressed understanding and agreed to proceed  Patient's insurance did not approve Mounjaro therefore patient has been continuing with her Victoza.   Patient reports diabetes was diagnosed in June 2022.   Insurance coverage/medication affordability: [employer]  Current diabetes medications include: Victoza 1.8mg  once daily, metformin XR 500mg  1 tablet in the morning and 2 tablets in the evening Patient states that She is not taking her medications as prescribed. She is taking less metformin due to tolerability issues. Patient reports adherence with medications. Patient states that She does not miss doses of her medications.  Do you feel that your medications are working for you?  yes  Have you been experiencing any side effects to the medications prescribed? no  Do you have any problems obtaining medications due to transportation or finances?  Yes; Mounjaro non-formulary   Patient reported dietary habits:  Eats 2-3 meals/day and 1 snacks/day Breakfast: protein smoothies (T/Th) and sometimes skips Lunch: sandwich  Dinner: protein smoothies (T/Th), protein,  starch, veggies Drinks: water Patient reports she previously struggled with late night snacking but since starting Victoza her appetite has decreased and she is eating smaller portions. 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  Patient denies polyphagia (increased appetite).  Patient denies polydipsia (increased thirst).  Patient reports neuropathy (nerve pain) in toes Patient reports visual changes but had eye appt recently and reports vision was good Patient reports self foot exams.   Patient states the highest her blood glucose has been lately was 126.  Objective:    Labs:   Lab Results  Component Value Date   HGBA1C 6.4 (A) 06/25/2021   HGBA1C 9.6 (A) 03/06/2021   HGBA1C 10.3 (A) 12/02/2020   There were no vitals filed for this visit.   Lab Results  Component Value Date   MICRALBCREAT 26 02/25/2020    Lipid Panel     Component Value Date/Time   CHOL 140 12/19/2019 1057   TRIG 109 12/19/2019 1057   HDL 32 (L) 12/19/2019 1057   CHOLHDL 4.4 12/19/2019 1057   CHOLHDL 4.2 02/22/2018 1227   VLDL 27 02/22/2018 1227   LDLCALC 88 12/19/2019 1057    Clinical Atherosclerotic Cardiovascular Disease (ASCVD): No  The 10-year ASCVD risk score (Arnett DK, et al., 2019) is: 3.5%   Values used to calculate the score:     Age: 7 years     Sex: Female     Is Non-Hispanic African American: Yes  Diabetic: Yes     Tobacco smoker: No     Systolic Blood Pressure: 120 mmHg     Is BP treated: No     HDL Cholesterol: 32 mg/dL     Total Cholesterol: 140 mg/dL   Assessment/Plan:   W0JW is controlled likely due to optimal lifestyle modifications. Medication adherence appears optimal. Insurance will not cover Mounjaro but patient willing to try  Ozempic in place of Victoza for additional weight loss benefits. Will switch patient from max dose Victoza 1.8mg  daily to Ozempic 1mg  once weekly with plan to titrate medication in one month. Following instruction patient verbalized understanding of treatment plan.   Discontinued GLP-1 Victoza 1.8mg  once daily and started Ozempic 1mg  once weekly Continued metformin 500mg  XR 1 tablet in the AM and 2 tablets in the PM Extensively discussed pathophysiology of diabetes, dietary effects on blood sugar control, and recommended lifestyle interventions. Counseled on s/sx of and management of hypoglycemia Next A1C anticipated April 2023  Follow-up appointment 4 weeks to titrate Ozempic. Written patient instructions provided.  This appointment required 30 minutes of direct patient care.  Thank you for involving pharmacy to assist in providing this patient's care.

## 2021-07-23 NOTE — Telephone Encounter (Signed)
DECISION :   Message from Plan  PA Case: [id number],   Status: Approved, Coverage Starts on: 07/23/2021 12:00 AM, Coverage Ends on: 07/23/2022 12:00 AM.    ( COPY SENT TO PHARMACY  ALSO )

## 2021-07-23 NOTE — Assessment & Plan Note (Signed)
°  T2DM is controlled likely due to optimal lifestyle modifications. Medication adherence appears optimal. Insurance will not cover Mounjaro but patient willing to try Ozempic in place of Victoza for additional weight loss benefits. Will switch patient from max dose Victoza 1.8mg  daily to Ozempic 1mg  once weekly with plan to titrate medication in one month. Following instruction patient verbalized understanding of treatment plan.   1. Discontinued GLP-1 Victoza 1.8mg  once daily and started Ozempic 1mg  once weekly 2. Continued metformin 500mg  XR 1 tablet in the AM and 2 tablets in the PM 3. Extensively discussed pathophysiology of diabetes, dietary effects on blood sugar control, and recommended lifestyle interventions. 4. Counseled on s/sx of and management of hypoglycemia 5. Next A1C anticipated April 2023

## 2021-07-23 NOTE — Telephone Encounter (Signed)
Pa for pt [facility] )  came through on cover my meds was submitted with office notes and labs from 2/9.Marland Kitchenawaiting approval or denial

## 2021-08-04 ENCOUNTER — Other Ambulatory Visit (HOSPITAL_COMMUNITY): Payer: Self-pay

## 2021-08-14 ENCOUNTER — Other Ambulatory Visit (HOSPITAL_COMMUNITY): Payer: Self-pay

## 2021-08-14 ENCOUNTER — Other Ambulatory Visit: Payer: Self-pay | Admitting: Internal Medicine

## 2021-08-17 ENCOUNTER — Other Ambulatory Visit (HOSPITAL_COMMUNITY): Payer: Self-pay

## 2021-08-17 ENCOUNTER — Telehealth: Payer: Self-pay | Admitting: Internal Medicine

## 2021-08-17 MED ORDER — OZEMPIC (1 MG/DOSE) 4 MG/3ML ~~LOC~~ SOPN
1.0000 mg | PEN_INJECTOR | SUBCUTANEOUS | 0 refills | Status: DC
  Filled 2021-08-17: qty 3, 28d supply, fill #0

## 2021-08-17 NOTE — Telephone Encounter (Signed)
Pt is requesting  call back .. she is wanting to know what is the next step .. I have told her what donna message stated .Marland Kitchen ?

## 2021-08-17 NOTE — Telephone Encounter (Signed)
Pt requesting a call back. P states she is Fasting x 1 week for her church and currently takes the 2 medications and has some concerns. ?metFORMIN (GLUCOPHAGE XR) 500 MG 24 hr tablet ? ? ?Semaglutide, 1 MG/DOSE, (OZEMPIC, 1 MG/DOSE,) 4 MG/3ML SOPN ? ?

## 2021-08-17 NOTE — Telephone Encounter (Signed)
Neither one of her medicines can cause low blood sugar. She should be fine.

## 2021-08-24 ENCOUNTER — Other Ambulatory Visit (HOSPITAL_COMMUNITY): Payer: Self-pay

## 2021-09-07 ENCOUNTER — Other Ambulatory Visit: Payer: Self-pay | Admitting: Internal Medicine

## 2021-09-07 ENCOUNTER — Other Ambulatory Visit (HOSPITAL_COMMUNITY): Payer: Self-pay

## 2021-09-08 ENCOUNTER — Other Ambulatory Visit: Payer: Self-pay | Admitting: Internal Medicine

## 2021-09-08 ENCOUNTER — Encounter: Payer: Self-pay | Admitting: Internal Medicine

## 2021-09-08 ENCOUNTER — Other Ambulatory Visit (HOSPITAL_COMMUNITY): Payer: Self-pay

## 2021-09-08 ENCOUNTER — Other Ambulatory Visit: Payer: Self-pay

## 2021-09-08 ENCOUNTER — Ambulatory Visit (INDEPENDENT_AMBULATORY_CARE_PROVIDER_SITE_OTHER): Payer: Self-pay | Admitting: Internal Medicine

## 2021-09-08 VITALS — BP 137/82 | HR 88 | Temp 98.5°F | Ht 65.0 in | Wt 276.2 lb

## 2021-09-08 DIAGNOSIS — Z1231 Encounter for screening mammogram for malignant neoplasm of breast: Secondary | ICD-10-CM

## 2021-09-08 DIAGNOSIS — E119 Type 2 diabetes mellitus without complications: Secondary | ICD-10-CM

## 2021-09-08 DIAGNOSIS — Z0000 Encounter for general adult medical examination without abnormal findings: Secondary | ICD-10-CM

## 2021-09-08 MED ORDER — OZEMPIC (1 MG/DOSE) 4 MG/3ML ~~LOC~~ SOPN
2.0000 mg | PEN_INJECTOR | SUBCUTANEOUS | 10 refills | Status: DC
Start: 2021-09-08 — End: 2021-09-23

## 2021-09-08 MED ORDER — OZEMPIC (1 MG/DOSE) 4 MG/3ML ~~LOC~~ SOPN
1.0000 mg | PEN_INJECTOR | SUBCUTANEOUS | 0 refills | Status: DC
  Filled 2021-09-08: qty 3, 28d supply, fill #0

## 2021-09-08 MED ORDER — METFORMIN HCL ER 500 MG PO TB24: 500.0000 mg | ORAL_TABLET | Freq: Two times a day (BID) | ORAL | 4 refills | Status: DC

## 2021-09-08 NOTE — Progress Notes (Signed)
? ?  CC: diabetes follow-up ? ?HPI: ? ?Sheena Soto is a 46 y.o. with past medical history as noted below who presents to the clinic today for a DM follow-up. Please see problem-based list for further details, assessments, and plans.  ? ?Past Medical History:  ?Diagnosis Date  ? Diabetes mellitus type II, non insulin dependent (HCC) 03/27/2018  ? ?Review of Systems: Negative aside from that listed in individualized problem based charting.  ? ?Physical Exam: ? ?Vitals:  ? 09/08/21 1441  ?BP: 137/82  ?Pulse: 88  ?Temp: 98.5 ?F (36.9 ?C)  ?TempSrc: Oral  ?SpO2: 100%  ?Weight: 276 lb 3.2 oz (125.3 kg)  ?Height: 5\' 5"  (1.651 m)  ? ?General: NAD, nl appearance ?HE: Normocephalic, atraumatic, EOMI, Conjunctivae normal ?ENT: No congestion, no rhinorrhea, no exudate or erythema  ?Cardiovascular: Normal rate, regular rhythm. No murmurs, rubs, or gallops ?Pulmonary: Effort normal, breath sounds normal. No wheezes, rales, or rhonchi ?Abdominal: soft, nontender, bowel sounds present ?Musculoskeletal: no swelling, deformity, injury or tenderness in extremities ?Skin: Warm, dry, no bruising, erythema, or rash ?Psychiatric/Behavioral: normal mood, normal behavior    ? ? ?Assessment & Plan:  ? ?See Encounters Tab for problem based charting. ? ?Patient discussed with Dr. ? ?

## 2021-09-08 NOTE — Patient Instructions (Signed)
Thank you, [name] for allowing [place] to provide your care today. Today we discussed your diabetes. ? ?Your new metformin regimen: 500 mg, two times daily with meals (for a total of 1000 mg daily) ? ?Ozempic regimen: 2 mg weekly ? ?I have ordered the following labs for you: ? ?Lab Orders    ?     Microalbumin / Creatinine Urine Ratio     ? ? ?Referrals ordered today:  ? ?Referral Orders  ?No referral(s) requested today  ?  ? ?I have ordered the following medication/changed the following medications:  ? ?Stop the following medications: ?Medications Discontinued During This Encounter  ?Medication Reason  ? metFORMIN (GLUCOPHAGE XR) 500 MG 24 hr tablet   ? Semaglutide, 1 MG/DOSE, (OZEMPIC, 1 MG/DOSE,) 4 MG/3ML SOPN   ?  ? ?Start the following medications: ?Meds ordered this encounter  ?Medications  ? metFORMIN (GLUCOPHAGE XR) 500 MG 24 hr tablet  ?  Sig: Take 1 tablet (500 mg total) by mouth 2 (two) times daily with a meal.  ?  Dispense:  120 tablet  ?  Refill:  4  ?  IM PROGRAM  ? Semaglutide, 1 MG/DOSE, (OZEMPIC, 1 MG/DOSE,) 4 MG/3ML SOPN  ?  Sig: Inject 2 mg into the skin once a week.  ?  Dispense:  3 mL  ?  Refill:  10  ?  ? ?Follow up: 3 months  ? ? ?Should you have any questions or concerns please call the internal medicine clinic at [phone].   ? ? ? ?

## 2021-09-09 LAB — MICROALBUMIN / CREATININE URINE RATIO
Creatinine, Urine: 126.8 mg/dL
Microalb/Creat Ratio: 19 mg/g creat (ref 0–29)
Microalbumin, Urine: 24.7 ug/mL

## 2021-09-10 NOTE — Assessment & Plan Note (Signed)
Screening mammogram ordered today ?

## 2021-09-10 NOTE — Assessment & Plan Note (Signed)
The patient is here today for a diabetes follow-up.  She states that she is on metformin 1000 mg in the morning and 500 mg at night.  She states that she is on this regimen specifically because high doses of metformin upset her stomach.  She has also been taking weekly Ozempic and is requesting refills today. ? ?Most recent A1c was 6.4 about 2.5 months ago. ? ?Plan: ?We will decrease metformin to 500 mg twice daily and increase Ozempic to 2 mg weekly at this time.  We will also check urine microalbumin today.  Patient declined A1c test today. ?

## 2021-09-11 NOTE — Progress Notes (Signed)
Internal Medicine Clinic Attending ° °Case discussed with Dr. Bonanno  °  At the time of the visit.  We reviewed the resident’s history and exam and pertinent patient test results.  I agree with the assessment, diagnosis, and plan of care documented in the resident’s note. ° °

## 2021-09-21 ENCOUNTER — Other Ambulatory Visit (HOSPITAL_COMMUNITY): Payer: Self-pay

## 2021-09-22 NOTE — Telephone Encounter (Signed)
PA staff spoke with Pharmacist at [facility] regarding PA request on Ozempic. Insurance will not pay for 2 boxes. Pharmacist is requesting the Rx be resent with dose changed to 8 mg/3 mL. ?

## 2021-09-23 ENCOUNTER — Telehealth: Payer: Self-pay | Admitting: Internal Medicine

## 2021-09-23 MED ORDER — OZEMPIC (2 MG/DOSE) 8 MG/3ML ~~LOC~~ SOPN
2.0000 mg | PEN_INJECTOR | SUBCUTANEOUS | 2 refills | Status: DC
  Filled 2021-09-24: qty 3, 28d supply, fill #0
  Filled 2021-10-14 – 2021-10-27 (×2): qty 3, 28d supply, fill #1
  Filled 2021-11-24 – 2021-12-11 (×3): qty 3, 28d supply, fill #2

## 2021-09-23 NOTE — Telephone Encounter (Signed)
Updated Rx was sent to [facility] today at 1016. ?

## 2021-09-23 NOTE — Telephone Encounter (Signed)
Open in error

## 2021-09-23 NOTE — Telephone Encounter (Signed)
Pt rtn phone call in Reference to her medication Refill Request. ? ?Pt states she spoke with AnnMaria on yesterday  and her medication has still not been refilled. ? ?The patient has contacted her pharmacy again today and wants to know why this has not been filled yet. Pt states she should not have to continue to wait for her medication as she was seen on 09/08/2021. ? ?Per pharmacy the medicine needs to be changed to match the dose pt is to take which is 8mg /52ml instead of 4mg /54ml .. pt has not been able to comply with her diabetes control because of this . ? ?Semaglutide, 1 MG/DOSE, (OZEMPIC, 1 MG/DOSE,) 4 MG/3ML SOPN ? ?[place] ([place]), [place] - [address] (Ph: [phone]) ?

## 2021-09-23 NOTE — Addendum Note (Signed)
Addended by: Ihor Dow on: 09/23/2021 10:16 AM ? ? Modules accepted: Orders ? ?

## 2021-09-23 NOTE — Addendum Note (Signed)
Addended by: Fredderick Severance on: 09/23/2021 10:33 AM ? ? Modules accepted: Orders ? ?

## 2021-09-24 ENCOUNTER — Other Ambulatory Visit (HOSPITAL_COMMUNITY): Payer: Self-pay

## 2021-09-25 ENCOUNTER — Other Ambulatory Visit: Payer: Self-pay | Admitting: Internal Medicine

## 2021-09-25 ENCOUNTER — Telehealth: Payer: Self-pay | Admitting: Internal Medicine

## 2021-09-25 DIAGNOSIS — Z1231 Encounter for screening mammogram for malignant neoplasm of breast: Secondary | ICD-10-CM

## 2021-09-25 NOTE — Telephone Encounter (Signed)
Rec'd a Call from the pt.  She is unable to sch her yearly mammogram.  Per the [facility] a new order will need to be placed for the following due to a Prev/Dx of Lumpectomy of the Left breast: ? ?New Orders Requested per [facility] ? ?US BREAST LTD UNI LEFT INC AXILLA ? ? MM DIAG BREAST TOMO BILATERAL  ? ?Please advise if these orders can be placed. ? ? ?

## 2021-09-30 ENCOUNTER — Other Ambulatory Visit: Payer: Self-pay | Admitting: Internal Medicine

## 2021-09-30 DIAGNOSIS — Z9889 Other specified postprocedural states: Secondary | ICD-10-CM | POA: Insufficient documentation

## 2021-09-30 DIAGNOSIS — Z1231 Encounter for screening mammogram for malignant neoplasm of breast: Secondary | ICD-10-CM | POA: Insufficient documentation

## 2021-09-30 DIAGNOSIS — N644 Mastodynia: Secondary | ICD-10-CM

## 2021-10-14 ENCOUNTER — Other Ambulatory Visit (HOSPITAL_COMMUNITY): Payer: Self-pay

## 2021-10-14 ENCOUNTER — Other Ambulatory Visit: Payer: Self-pay | Admitting: Internal Medicine

## 2021-10-14 DIAGNOSIS — E119 Type 2 diabetes mellitus without complications: Secondary | ICD-10-CM

## 2021-10-14 MED ORDER — METFORMIN HCL ER 500 MG PO TB24
500.0000 mg | ORAL_TABLET | Freq: Two times a day (BID) | ORAL | 4 refills | Status: DC
  Filled 2021-10-14: qty 60, 30d supply, fill #0
  Filled 2021-12-29: qty 120, 60d supply, fill #1

## 2021-10-14 NOTE — Telephone Encounter (Signed)
Patient was called and would like for new prescription to be sent to the [facility].  ?

## 2021-10-15 ENCOUNTER — Other Ambulatory Visit (HOSPITAL_COMMUNITY): Payer: Self-pay

## 2021-10-26 ENCOUNTER — Other Ambulatory Visit: Payer: Medicaid Other

## 2021-10-27 ENCOUNTER — Other Ambulatory Visit (HOSPITAL_COMMUNITY): Payer: Self-pay

## 2021-11-12 ENCOUNTER — Other Ambulatory Visit: Payer: Medicaid Other

## 2021-11-25 ENCOUNTER — Other Ambulatory Visit (HOSPITAL_COMMUNITY): Payer: Self-pay

## 2021-12-02 ENCOUNTER — Other Ambulatory Visit (HOSPITAL_COMMUNITY): Payer: Self-pay

## 2021-12-10 ENCOUNTER — Other Ambulatory Visit (HOSPITAL_COMMUNITY): Payer: Self-pay

## 2021-12-11 ENCOUNTER — Other Ambulatory Visit (HOSPITAL_COMMUNITY): Payer: Self-pay

## 2021-12-21 ENCOUNTER — Ambulatory Visit: Payer: Medicaid Other

## 2021-12-21 ENCOUNTER — Ambulatory Visit
Admission: RE | Admit: 2021-12-21 | Discharge: 2021-12-21 | Disposition: A | Discharge status: Home / Self Care | Payer: Commercial Managed Care - HMO | Source: Ambulatory Visit | Attending: Internal Medicine | Admitting: Internal Medicine

## 2021-12-21 DIAGNOSIS — N644 Mastodynia: Secondary | ICD-10-CM

## 2021-12-29 ENCOUNTER — Other Ambulatory Visit (HOSPITAL_COMMUNITY): Payer: Self-pay

## 2022-01-17 ENCOUNTER — Other Ambulatory Visit: Payer: Self-pay | Admitting: Internal Medicine

## 2022-01-18 ENCOUNTER — Other Ambulatory Visit: Payer: Self-pay | Admitting: Internal Medicine

## 2022-01-18 ENCOUNTER — Other Ambulatory Visit (HOSPITAL_COMMUNITY): Payer: Self-pay

## 2022-01-19 ENCOUNTER — Other Ambulatory Visit (HOSPITAL_COMMUNITY): Payer: Self-pay

## 2022-01-19 MED ORDER — OZEMPIC (2 MG/DOSE) 8 MG/3ML ~~LOC~~ SOPN
2.0000 mg | PEN_INJECTOR | SUBCUTANEOUS | 2 refills | Status: DC
  Filled 2022-01-19 – 2022-02-26 (×6): qty 3, 28d supply, fill #0

## 2022-01-20 ENCOUNTER — Other Ambulatory Visit (HOSPITAL_COMMUNITY)
Admission: RE | Admit: 2022-01-20 | Discharge: 2022-01-20 | Disposition: A | Discharge status: Home / Self Care | Payer: Commercial Managed Care - HMO | Source: Ambulatory Visit | Attending: Internal Medicine | Admitting: Internal Medicine

## 2022-01-20 ENCOUNTER — Ambulatory Visit (INDEPENDENT_AMBULATORY_CARE_PROVIDER_SITE_OTHER): Payer: Commercial Managed Care - HMO | Admitting: Internal Medicine

## 2022-01-20 ENCOUNTER — Other Ambulatory Visit (HOSPITAL_COMMUNITY): Payer: Self-pay

## 2022-01-20 ENCOUNTER — Encounter: Payer: Self-pay | Admitting: Internal Medicine

## 2022-01-20 ENCOUNTER — Other Ambulatory Visit: Payer: Self-pay

## 2022-01-20 VITALS — BP 122/64 | HR 81 | Temp 98.4°F | Ht 65.0 in | Wt 279.2 lb

## 2022-01-20 DIAGNOSIS — Z113 Encounter for screening for infections with a predominantly sexual mode of transmission: Secondary | ICD-10-CM | POA: Insufficient documentation

## 2022-01-20 DIAGNOSIS — Z1211 Encounter for screening for malignant neoplasm of colon: Secondary | ICD-10-CM

## 2022-01-20 DIAGNOSIS — E119 Type 2 diabetes mellitus without complications: Secondary | ICD-10-CM

## 2022-01-20 DIAGNOSIS — Z711 Person with feared health complaint in whom no diagnosis is made: Secondary | ICD-10-CM | POA: Insufficient documentation

## 2022-01-20 DIAGNOSIS — N926 Irregular menstruation, unspecified: Secondary | ICD-10-CM | POA: Insufficient documentation

## 2022-01-20 DIAGNOSIS — T7840XA Allergy, unspecified, initial encounter: Secondary | ICD-10-CM

## 2022-01-20 LAB — POCT GLYCOSYLATED HEMOGLOBIN (HGB A1C): Hemoglobin A1C: 6.3 % — AB (ref 4.0–5.6)

## 2022-01-20 LAB — GLUCOSE, CAPILLARY: Glucose-Capillary: 85 mg/dL (ref 70–99)

## 2022-01-20 NOTE — Patient Instructions (Signed)
Ms. Mander,  It was great to see you today!  Your hard work at controlling your diabetes is paying off. I am so proud! Continue taking Ozempic 2 mg once weekly and metformin 500 mg twice daily.  I am checking your urine for STDs and running a pregnancy test as well. I will let you know when these results come back.   I am also referring you for colonoscopy.  Please return in 3 months for regular follow-up.  My best, Dr. August Saucer
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
## 2022-01-21 DIAGNOSIS — Z1211 Encounter for screening for malignant neoplasm of colon: Secondary | ICD-10-CM | POA: Insufficient documentation

## 2022-01-21 DIAGNOSIS — T7840XA Allergy, unspecified, initial encounter: Secondary | ICD-10-CM | POA: Insufficient documentation

## 2022-01-21 LAB — URINE CYTOLOGY ANCILLARY ONLY
Bacterial Vaginitis-Urine: NEGATIVE
Chlamydia: NEGATIVE
Comment: NEGATIVE
Comment: NEGATIVE
Comment: NORMAL
Neisseria Gonorrhea: NEGATIVE
Trichomonas: NEGATIVE

## 2022-01-21 LAB — PREGNANCY, URINE: Preg Test, Ur: NEGATIVE

## 2022-01-21 NOTE — Progress Notes (Signed)
Internal Medicine Clinic Attending ? ?Case discussed with Dr. Dean  At the time of the visit.  We reviewed the resident?s history and exam and pertinent patient test results.  I agree with the assessment, diagnosis, and plan of care documented in the resident?s note.  ?

## 2022-01-21 NOTE — Assessment & Plan Note (Signed)
Patient is concerned for possible STD exposure after sexual intercourse 1 month ago.  She is not currently having any specific symptoms but says that she feels "off."She is requesting STD testing at today's visit. Plan: Testing for chlamydia, gonorrhea, trichomonas, bacterial vaginosis ordered at today's visit.

## 2022-01-21 NOTE — Assessment & Plan Note (Signed)
Patient is due to start screening colonoscopies. Plan: Referral to GI for colonoscopy placed.
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
## 2022-01-21 NOTE — Assessment & Plan Note (Signed)
Patient reports her menstrual cycle is late.  FDLMP was June 22.  Normally her menstrual cycle is regular.  She did have unprotected intercourse in the time since her last menstrual cycle. Plan: Obtain urine pregnancy test at today's visit.  If positive we will refer to OB/GYN and she will need to come in to discuss medication changes if she chooses to continue pregnancy.

## 2022-01-21 NOTE — Assessment & Plan Note (Signed)
Patient expresses concern about a possible shrimp allergy.  She says that the last time she attempted to eat shrimp her tongue began to feel weird and she felt that her swelling was off.  She did not develop anaphylaxis.  She also reports abnormal responses to acidic food.  She is requesting referral to an allergy specialist today. Plan: Referral placed to allergy specialist.

## 2022-01-27 ENCOUNTER — Other Ambulatory Visit (HOSPITAL_COMMUNITY): Payer: Self-pay

## 2022-01-27 ENCOUNTER — Telehealth: Payer: Self-pay

## 2022-01-27 NOTE — Telephone Encounter (Signed)
Pa for pt new ( OZEMPIC ) 2 Mg dose  ( 8mg /3MG  )   came through on cover my meds was submitted with last office notes and labs . Awaiting approval or denial .        UPDATE :   Message from Plan  CaseId:[id number];Status:Approved;Review   Type:Prior Auth;Coverage Start Date:01/27/2022;  Coverage End Date:01/27/2023;       ( COPY SENT TO PHARMACY ALSO )

## 2022-01-29 ENCOUNTER — Other Ambulatory Visit: Payer: Self-pay | Admitting: *Deleted

## 2022-01-29 DIAGNOSIS — E119 Type 2 diabetes mellitus without complications: Secondary | ICD-10-CM

## 2022-01-29 MED ORDER — METFORMIN HCL ER 500 MG PO TB24: 500.0000 mg | ORAL_TABLET | Freq: Two times a day (BID) | ORAL | 4 refills | Status: DC

## 2022-02-01 ENCOUNTER — Ambulatory Visit: Payer: Medicaid Other | Admitting: Obstetrics and Gynecology

## 2022-02-26 ENCOUNTER — Other Ambulatory Visit (HOSPITAL_COMMUNITY): Payer: Self-pay

## 2022-03-11 ENCOUNTER — Telehealth: Payer: Self-pay | Admitting: *Deleted

## 2022-03-11 NOTE — Telephone Encounter (Signed)
Wow, that's a lot! Let me confirm plan with Dr. Dareen Piano and send something in for her.

## 2022-03-11 NOTE — Telephone Encounter (Signed)
Patient called unable to get her Ozempic.  Feels her sugars are elevated .  Goes to mid 200's.  Wants to know if she can get something else. Send to [facility].

## 2022-03-11 NOTE — Telephone Encounter (Signed)
Patient is unable to afford the $700-$500 copays.

## 2022-03-12 ENCOUNTER — Other Ambulatory Visit (HOSPITAL_COMMUNITY): Payer: Self-pay

## 2022-03-15 ENCOUNTER — Encounter: Payer: Self-pay | Admitting: Internal Medicine

## 2022-03-15 ENCOUNTER — Telehealth: Payer: Self-pay | Admitting: Internal Medicine

## 2022-03-15 MED ORDER — TRULICITY 0.75 MG/0.5ML ~~LOC~~ SOAJ: 0.7500 mg | SUBCUTANEOUS | 2 refills | Status: DC

## 2022-03-15 NOTE — Telephone Encounter (Signed)
Patient contacted our clinic regarding her Ozempic copay being $600-$700 and therefor unaffordable. I have sent in Trulicity for her to try as after speaking with our clinical pharmacist, I learned that the copay should be around $25 per month. Ms. Hinsch is in agreement with this change.  Farrel Gordon, DO

## 2022-03-16 ENCOUNTER — Ambulatory Visit: Payer: Commercial Managed Care - HMO | Admitting: Internal Medicine

## 2022-03-16 ENCOUNTER — Other Ambulatory Visit (HOSPITAL_COMMUNITY): Payer: Self-pay

## 2022-03-20 ENCOUNTER — Encounter (HOSPITAL_BASED_OUTPATIENT_CLINIC_OR_DEPARTMENT_OTHER): Payer: Self-pay | Admitting: Emergency Medicine

## 2022-03-20 ENCOUNTER — Emergency Department (HOSPITAL_BASED_OUTPATIENT_CLINIC_OR_DEPARTMENT_OTHER)
Admission: EM | Admit: 2022-03-20 | Discharge: 2022-03-20 | Disposition: A | Discharge status: Home / Self Care | Payer: Commercial Managed Care - HMO | Attending: Emergency Medicine | Admitting: Emergency Medicine

## 2022-03-20 ENCOUNTER — Emergency Department (HOSPITAL_BASED_OUTPATIENT_CLINIC_OR_DEPARTMENT_OTHER): Payer: Commercial Managed Care - HMO

## 2022-03-20 ENCOUNTER — Other Ambulatory Visit: Payer: Self-pay

## 2022-03-20 DIAGNOSIS — J029 Acute pharyngitis, unspecified: Secondary | ICD-10-CM | POA: Insufficient documentation

## 2022-03-20 DIAGNOSIS — Z20822 Contact with and (suspected) exposure to covid-19: Secondary | ICD-10-CM | POA: Insufficient documentation

## 2022-03-20 LAB — CBC WITH DIFFERENTIAL/PLATELET
Abs Immature Granulocytes: 0.03 10*3/uL (ref 0.00–0.07)
Basophils Absolute: 0.1 10*3/uL (ref 0.0–0.1)
Basophils Relative: 1 %
Eosinophils Absolute: 0.4 10*3/uL (ref 0.0–0.5)
Eosinophils Relative: 4 %
HCT: 37.4 % (ref 36.0–46.0)
Hemoglobin: 12.6 g/dL (ref 12.0–15.0)
Immature Granulocytes: 0 %
Lymphocytes Relative: 36 %
Lymphs Abs: 3.8 10*3/uL (ref 0.7–4.0)
MCH: 28.3 pg (ref 26.0–34.0)
MCHC: 33.7 g/dL (ref 30.0–36.0)
MCV: 84 fL (ref 80.0–100.0)
Monocytes Absolute: 0.6 10*3/uL (ref 0.1–1.0)
Monocytes Relative: 6 %
Neutro Abs: 5.6 10*3/uL (ref 1.7–7.7)
Neutrophils Relative %: 53 %
Platelets: 362 10*3/uL (ref 150–400)
RBC: 4.45 MIL/uL (ref 3.87–5.11)
RDW: 14.2 % (ref 11.5–15.5)
WBC: 10.5 10*3/uL (ref 4.0–10.5)
nRBC: 0 % (ref 0.0–0.2)

## 2022-03-20 LAB — BASIC METABOLIC PANEL
Anion gap: 11 (ref 5–15)
BUN: 11 mg/dL (ref 6–20)
CO2: 28 mmol/L (ref 22–32)
Calcium: 9.5 mg/dL (ref 8.9–10.3)
Chloride: 100 mmol/L (ref 98–111)
Creatinine, Ser: 0.9 mg/dL (ref 0.44–1.00)
GFR, Estimated: >60 mL/min (ref >60)
Glucose, Bld: 148 mg/dL — ABNORMAL HIGH (ref 70–99)
Potassium: 3.8 mmol/L (ref 3.5–5.1)
Sodium: 139 mmol/L (ref 135–145)

## 2022-03-20 LAB — SARS CORONAVIRUS 2 BY RT PCR: SARS Coronavirus 2 by RT PCR: NEGATIVE

## 2022-03-20 LAB — GROUP A STREP BY PCR: Group A Strep by PCR: NOT DETECTED

## 2022-03-20 MED ORDER — KETOROLAC TROMETHAMINE 30 MG/ML IJ SOLN
30.0000 mg | Freq: Once | INTRAMUSCULAR | Status: AC
  Administered 2022-03-20: 30 mg via INTRAVENOUS
  Filled 2022-03-20: qty 1

## 2022-03-20 MED ORDER — LIDOCAINE VISCOUS HCL 2 % MT SOLN
15.0000 mL | Freq: Once | OROMUCOSAL | Status: AC
  Administered 2022-03-20: 15 mL via OROMUCOSAL
  Filled 2022-03-20: qty 15

## 2022-03-20 MED ORDER — HYDROCODONE-ACETAMINOPHEN 5-325 MG PO TABS: 1.0000 | ORAL_TABLET | ORAL | 0 refills | Status: DC | PRN

## 2022-03-20 MED ORDER — IOHEXOL 300 MG/ML  SOLN
100.0000 mL | Freq: Once | INTRAMUSCULAR | Status: AC | PRN
  Administered 2022-03-20: 75 mL via INTRAVENOUS

## 2022-03-20 MED ORDER — PREDNISONE 50 MG PO TABS: 50.0000 mg | ORAL_TABLET | Freq: Every day | ORAL | 0 refills | Status: DC

## 2022-03-20 MED ORDER — AMOXICILLIN 500 MG PO CAPS: 500.0000 mg | ORAL_CAPSULE | Freq: Three times a day (TID) | ORAL | 0 refills | Status: DC

## 2022-03-20 MED ORDER — DEXAMETHASONE SODIUM PHOSPHATE 10 MG/ML IJ SOLN
10.0000 mg | Freq: Once | INTRAMUSCULAR | Status: AC
  Administered 2022-03-20: 10 mg via INTRAVENOUS
  Filled 2022-03-20: qty 1

## 2022-03-20 NOTE — ED Provider Notes (Signed)
[facility] EMERGENCY DEPT Provider Note   CSN: [id number] Arrival date & time: 03/20/22  1607     History  Chief Complaint  Patient presents with   Sore Throat   HPI Sheena Soto is a 45 y.o. female presenting for sore throat.  Symptoms started Wednesday night.  Throat is sore with difficulty swallowing and also endorsing left-sided neck pain which extends from her jawline down the neck.  Stated that she was recently exposed to a family member who had strep throat.  Denies fever but says she has had a cough and recent history of upper respiratory infection.  She also mentioned that her left ear has been painful but no changing in hearing or tinnitus.   Sore Throat       Home Medications Prior to Admission medications   Medication Sig Start Date End Date Taking? Authorizing Provider  Dulaglutide (TRULICITY) 2.13 YQ/6.5HQ SOPN Inject 0.75 mg into the skin once a week. 03/15/22   Farrel Gordon, DO  metFORMIN (GLUCOPHAGE-XR) 500 MG 24 hr tablet Take 1 tablet (500 mg total) by mouth 2 (two) times daily with a meal. 01/29/22   Farrel Gordon, DO      Allergies    Naprosyn [naproxen]    Review of Systems   Review of Systems  HENT:  Positive for sore throat.   Musculoskeletal:  Positive for neck pain.    Physical Exam Updated Vital Signs BP 133/82   Pulse 84   Temp 98.1 F (36.7 C)   Resp 17   SpO2 100%  Physical Exam Vitals and nursing note reviewed.  HENT:     Head: Normocephalic and atraumatic.     Left Ear: Tympanic membrane is erythematous.     Mouth/Throat:     Mouth: Mucous membranes are moist.     Pharynx: Uvula midline. No oropharyngeal exudate.     Comments: Tonsils erythematous and enlarged (right greater than left) Eyes:     General:        Right eye: No discharge.        Left eye: No discharge.     Conjunctiva/sclera: Conjunctivae normal.  Neck:     Thyroid: No thyromegaly.     Vascular: Normal carotid pulses. No carotid bruit or JVD.   Cardiovascular:     Rate and Rhythm: Normal rate and regular rhythm.     Pulses: Normal pulses.     Heart sounds: Normal heart sounds.  Pulmonary:     Effort: Pulmonary effort is normal.     Breath sounds: Normal breath sounds.  Abdominal:     General: Abdomen is flat.     Palpations: Abdomen is soft.  Musculoskeletal:     Cervical back: Normal range of motion and neck supple.  Lymphadenopathy:     Cervical: No cervical adenopathy.  Skin:    General: Skin is warm and dry.  Neurological:     General: No focal deficit present.  Psychiatric:        Mood and Affect: Mood normal.     ED Results / Procedures / Treatments   Labs (all labs ordered are listed, but only abnormal results are displayed) Labs Reviewed  GROUP A STREP BY PCR  SARS CORONAVIRUS 2 BY RT PCR    EKG None  Radiology No results found.  Procedures Procedures    Medications Ordered in ED Medications  ketorolac (TORADOL) 30 MG/ML injection 30 mg (has no administration in time range)  dexamethasone (DECADRON) injection 10 mg (has no  administration in time range)  lidocaine (XYLOCAINE) 2 % viscous mouth solution 15 mL (has no administration in time range)    ED Course/ Medical Decision Making/ A&P                           Medical Decision Making  This patient presents to the ED for concern of sore throat and neck pain, this involves a number of treatment options, and is a complaint that carries with it a moderate risk of complications and morbidity.  The differential diagnosis includes peritonsillar abscess, retropharyngeal abscess, deep space abscess of the neck, carotid artery dissection, and cervical lymphadenopathy.   Co morbidities: Discussed in HPI   EMR reviewed including pt PMHx, past surgical history and past visits to ER.   See HPI for more details   Lab Tests:   BMP still pending otherwise labs reassuring.   Imaging Studies:  Ordered CT soft tissue neck with  contrast.    Cardiac Monitoring:  The patient was maintained on a cardiac monitor.  I personally viewed and interpreted the cardiac monitored which showed an underlying rhythm of: NSR NA   Medicines ordered:  I ordered medication including ordered Toradol for pain and inflammation.  Ordered Decadron for inflammation.  Ordered viscous lidocaine for symptomatic relief. Reevaluation of the patient after these medicines showed that the patient improved.  Upon reevaluation patient stated that she was feeling much better. I have reviewed the patients home medicines and have made adjustments as needed   Critical Interventions:  No critical interventions at this time   Consults/Attending Physician   I discussed this case with my attending physician who cosigned this note including patient's presenting symptoms, physical exam, and planned diagnostics and interventions. Attending physician stated agreement with plan or made changes to plan which were implemented.   Reevaluation:  After the interventions noted above I re-evaluated patient and found that they have :improved    Problem List / ED Course: Presented for sore throat and neck pain. Examination of her mouth and throat revealed enlarged tonsils right greater than left.  Examination of her neck was grossly normal nevertheless she continues to endorse left-sided neck tenderness. At this time peritonsillar abscess versus retropharyngeal abscess versus deep space abscess of the neck cannot be ruled out. Ordered CT scan of the soft tissue of the neck. Considered cervical artery dissection but unlikely given no neck bruits, facial paralysis or cranial nerve involvement. Signed out patient to ED physician, Dr. Particia Nearing.  Dispostion: Will await results of the CT scan for disposition.         Final Clinical Impression(s) / ED Diagnoses Final diagnoses:  None    Rx / DC Orders ED Discharge Orders     None          Gareth Eagle, PA-C 03/20/22 2200    Jacalyn Lefevre, MD 03/20/22 2221    Jacalyn Lefevre, MD 03/20/22 2320

## 2022-03-20 NOTE — ED Triage Notes (Signed)
Pt presents to ED POV. Pt reports sore throat since wed. Pt reports contact w/ someone who had strep.

## 2022-03-24 ENCOUNTER — Telehealth: Payer: Self-pay

## 2022-03-30 ENCOUNTER — Encounter: Payer: Self-pay | Admitting: Obstetrics

## 2022-03-30 ENCOUNTER — Other Ambulatory Visit (HOSPITAL_COMMUNITY)
Admission: RE | Admit: 2022-03-30 | Discharge: 2022-03-30 | Disposition: A | Discharge status: Home / Self Care | Payer: Commercial Managed Care - HMO | Source: Ambulatory Visit | Attending: Obstetrics and Gynecology | Admitting: Obstetrics and Gynecology

## 2022-03-30 ENCOUNTER — Ambulatory Visit (INDEPENDENT_AMBULATORY_CARE_PROVIDER_SITE_OTHER): Payer: Commercial Managed Care - HMO | Admitting: Obstetrics

## 2022-03-30 VITALS — BP 123/81 | HR 86 | Ht 65.0 in | Wt 279.0 lb

## 2022-03-30 DIAGNOSIS — Z01419 Encounter for gynecological examination (general) (routine) without abnormal findings: Secondary | ICD-10-CM

## 2022-03-30 DIAGNOSIS — N946 Dysmenorrhea, unspecified: Secondary | ICD-10-CM

## 2022-03-30 DIAGNOSIS — J301 Allergic rhinitis due to pollen: Secondary | ICD-10-CM

## 2022-03-30 DIAGNOSIS — Z6841 Body Mass Index (BMI) 40.0 and over, adult: Secondary | ICD-10-CM

## 2022-03-30 MED ORDER — DESLORATADINE 5 MG PO TABS: 5.0000 mg | ORAL_TABLET | Freq: Every day | ORAL | 11 refills | Status: DC

## 2022-03-30 MED ORDER — IBUPROFEN 800 MG PO TABS: 800.0000 mg | ORAL_TABLET | Freq: Three times a day (TID) | ORAL | 5 refills | Status: DC | PRN

## 2022-03-30 MED ORDER — PNV-DHA+DOCUSATE 27-1.25-300 MG PO CAPS: 1.0000 | ORAL_CAPSULE | Freq: Every day | ORAL | 4 refills | Status: DC

## 2022-03-30 NOTE — Progress Notes (Signed)
Subjective:        Sheena Soto is a 46 y.o. female here for a routine exam.  Current complaints: Irregular periods.  No spotting in between periods..    Personal health questionnaire:  Is patient Ashkenazi Jewish, have a family history of breast and/or ovarian cancer: no Is there a family history of uterine cancer diagnosed at age < 72, gastrointestinal cancer, urinary tract cancer, family member who is a Personnel officer syndrome-associated carrier: no Is the patient overweight and hypertensive, family history of diabetes, personal history of gestational diabetes, preeclampsia or PCOS: no Is patient over 40, have PCOS,  family history of premature CHD under age 24, diabetes, smoke, have hypertension or peripheral artery disease:  no At any time, has a partner hit, kicked or otherwise hurt or frightened you?: no Over the past 2 weeks, have you felt down, depressed or hopeless?: no Over the past 2 weeks, have you felt little interest or pleasure in doing things?:no   Gynecologic History Patient's last menstrual period was 02/17/2022 (exact date). Contraception: none Last Pap: 2021. Results were: normal Last mammogram: 2023. Results were: normal  Obstetric History OB History  Gravida Para Term Preterm AB Living  3 1 1   2 1   SAB IAB Ectopic Multiple Live Births  1 1     1     # Outcome Date GA Lbr Len/2nd Weight Sex Delivery Anes PTL Lv  3 Term 07/08/00     CS-Unspec   LIV  2 SAB           1 IAB             Past Medical History:  Diagnosis Date   Diabetes mellitus type II, non insulin dependent (HCC) 03/27/2018    Past Surgical History:  Procedure Laterality Date   BREAST BIOPSY Left    BREAST EXCISIONAL BIOPSY Left    Pt stated she had mass removed, doctor not sure what it was   BREAST SURGERY     CESAREAN SECTION     HERNIA REPAIR       Current Outpatient Medications:    amoxicillin (AMOXIL) 500 MG capsule, Take 1 capsule (500 mg total) by mouth 3 (three) times  daily., Disp: 21 capsule, Rfl: 0   desloratadine (CLARINEX) 5 MG tablet, Take 1 tablet (5 mg total) by mouth daily., Disp: 30 tablet, Rfl: 11   Dulaglutide (TRULICITY) 0.75 MG/0.5ML SOPN, Inject 0.75 mg into the skin once a week., Disp: 2 mL, Rfl: 2   ibuprofen (ADVIL) 800 MG tablet, Take 1 tablet (800 mg total) by mouth every 8 (eight) hours as needed., Disp: 30 tablet, Rfl: 5   metFORMIN (GLUCOPHAGE-XR) 500 MG 24 hr tablet, Take 1 tablet (500 mg total) by mouth 2 (two) times daily with a meal., Disp: 120 tablet, Rfl: 4   Prenat-FeFum-DSS-FA-DHA w/o A (PNV-DHA+DOCUSATE) 27-1.25-300 MG CAPS, Take 1 capsule by mouth daily before breakfast., Disp: 90 capsule, Rfl: 4   HYDROcodone-acetaminophen (NORCO/VICODIN) 5-325 MG tablet, Take 1 tablet by mouth every 4 (four) hours as needed. (Patient not taking: Reported on 03/30/2022), Disp: 10 tablet, Rfl: 0   predniSONE (DELTASONE) 50 MG tablet, Take 1 tablet (50 mg total) by mouth daily with breakfast. (Patient not taking: Reported on 03/30/2022), Disp: 5 tablet, Rfl: 0 Allergies  Allergen Reactions   Naprosyn [Naproxen] Other (See Comments)    Flared up diverticulitis    Social History   Tobacco Use   Smoking status: Never   Smokeless tobacco: Never  Substance Use Topics   Alcohol use: Yes    Comment: occassionally    Family History  Problem Relation Age of Onset   Diabetes Mother    Hypertension Mother    Atrial fibrillation Mother    Fibroids Mother    Diabetes Maternal Grandmother    Hypertension Maternal Grandmother    Heart attack Maternal Uncle 48      Review of Systems  Constitutional: negative for fatigue and weight loss Respiratory: negative for cough and wheezing Cardiovascular: negative for chest pain, fatigue and palpitations Gastrointestinal: negative for abdominal pain and change in bowel habits Musculoskeletal:negative for myalgias Neurological: negative for gait problems and tremors Behavioral/Psych: negative for  abusive relationship, depression Endocrine: negative for temperature intolerance    Genitourinary:positive for irregular menstrual periods.  Negative for genital lesions, hot flashes, sexual problems and vaginal discharge Integument/breast: negative for breast lump, breast tenderness, nipple discharge and skin lesion(s)    Objective:       BP 123/81   Pulse 86   Ht 5\' 5"  (1.651 m)   Wt 279 lb (126.6 kg)   LMP 02/17/2022 (Exact Date)   BMI 46.43 kg/m  General:   Alert and no distress  Skin:   no rash or abnormalities  Lungs:   clear to auscultation bilaterally  Heart:   regular rate and rhythm, S1, S2 normal, no murmur, click, rub or gallop  Breasts:   normal without suspicious masses, skin or nipple changes or axillary nodes  Abdomen:  normal findings: no organomegaly, soft, non-tender and no hernia  Pelvis:  External genitalia: normal general appearance Urinary system: urethral meatus normal and bladder without fullness, nontender Vaginal: normal without tenderness, induration or masses Cervix: normal appearance Adnexa: normal bimanual exam Uterus: anteverted and non-tender, normal size   Lab Review Urine pregnancy test Labs reviewed yes Radiologic studies reviewed yes  I have spent a total of 30 minutes of face-to-face and non-face-to-face time, excluding clinical staff time, reviewing notes and preparing to see patient, ordering tests and/or medications, and counseling the patient.   Assessment:    1. Encounter for routine gynecological examination with Papanicolaou smear of cervix Rx: - Cytology - PAP( [facility]) - Prenat-FeFum-DSS-FA-DHA w/o A (PNV-DHA+DOCUSATE) 27-1.25-300 MG CAPS; Take 1 capsule by mouth daily before breakfast.  Dispense: 90 capsule; Refill: 4  2. Dysmenorrhea Rx: - ibuprofen (ADVIL) 800 MG tablet; Take 1 tablet (800 mg total) by mouth every 8 (eight) hours as needed.  Dispense: 30 tablet; Refill: 5  3. Seasonal allergic rhinitis due to  pollen Rx: - desloratadine (CLARINEX) 5 MG tablet; Take 1 tablet (5 mg total) by mouth daily.  Dispense: 30 tablet; Refill: 11  4. Class 3 severe obesity due to excess calories without serious comorbidity with body mass index (BMI) of 45.0 to 49.9 in adult [facility]) - caloric reduction, exercise and behavioral modification recommended     Plan:    Education reviewed: calcium supplements, depression evaluation, low fat, low cholesterol diet, safe sex/STD prevention, self breast exams, and weight bearing exercise. Contraception: none. Follow up in: 1 year.   Meds ordered this encounter  Medications   Prenat-FeFum-DSS-FA-DHA w/o A (PNV-DHA+DOCUSATE) 27-1.25-300 MG CAPS    Sig: Take 1 capsule by mouth daily before breakfast.    Dispense:  90 capsule    Refill:  4   ibuprofen (ADVIL) 800 MG tablet    Sig: Take 1 tablet (800 mg total) by mouth every 8 (eight) hours as needed.    Dispense:  30 tablet    Refill:  5   desloratadine (CLARINEX) 5 MG tablet    Sig: Take 1 tablet (5 mg total) by mouth daily.    Dispense:  30 tablet    Refill:  11      Brock Bad, MD 03/30/2022 11:26 AM

## 2022-03-30 NOTE — Progress Notes (Signed)
46 y.o GYN presents for AEX/PAP.  C/o bump on labia.  She is skipping  periods.  Needs refills on PNV  Last Mammogram 12/21/2021

## 2022-04-01 LAB — CYTOLOGY - PAP
Adequacy: ABSENT
Comment: NEGATIVE
Diagnosis: NEGATIVE
High risk HPV: NEGATIVE

## 2022-04-11 NOTE — Progress Notes (Deleted)
Cardiology Office Note Date:  04/11/2022  Patient ID:  Sheena Soto, Sheena Soto January 31, 1976, MRN [id number] PCP:  Champ Mungo, DO  Cardiologist:  Dr. Eldridge Dace Electrophysiologist: Dr. Graciela Husbands    Chief Complaint:  annual visit  History of Present Illness: Sheena Soto is a 46 y.o. female with history of DM, morbid obesity, OSA, does not use CPAP,  NSVT  Palpitations with H/o NS-VT - with structurally normal heart and normal SA ECG.    ? FHx of sudden death (single vehicle MVA)  - cMRI 12/2018 with normal LV and RVEF with no LGE. No evidence of ARVC.  She comes in today to be seen for Dr. Graciela Husbands and Sheena Soto, [place],  last seen by him July 2020.  At that time she was getting ready to start college and so was her daughter. No changes were made, she was pending a sleep study and encouraged weight loss  Telephone notes in Sept report recent increase in palpitations, ?reduced O2 sats, she thinks perhaps connected to a problem with her stove?  She had a GYN visit that day with stable VS.  I saw her Oct 2021 She is doing very well. She is exercising, dieting and actively losing weight.  She has a trainer that sends her workouts to complete and this has been very good for her. 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 She has a new stove and hs not had that opressive feeling since then,. She explains that she rarely cooks at home, and had not used the stove in months and when she put the oven on, eventually developed an unusual feeling of SOB, went to the kitchen and became aware of a unusual fume/odor cut off the stove and went outside and sat in her car with the a/c.  She almost immediately felt ebtter and eventually her HR and O2 levels evened out. Out sideof that she has had no cardiac awareness, no dizzy spells, near syncope or syncope. She has occassional sharp fleeting CP as well as chest wall  tenderness that she feels is 2/2 to her large breasts and chest wall aching. Since losing weight she no longer wakes though does still snore. No SOB, feels like she has good exertional capacity with her exercising and ADLs No changes, encouraged to continue weight loss and revisit sleep study/apnea.  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 She is on the go constantly without SOB She sings and has goo exertional capacity and lung capacity No near syncope or cynaope No overt palpitations, sometimes she notes by  her technology her HR up sometimes 90's-or so.  She has found that she is so tired at the end of the days that she has been falling asleep on the couch and waking there as well, and now thinking about it, thinks some of her aches/pains are because of this.  CP felt to be atypical Discussed heart healthy lifestyle No symptoms/palpitations Planned for annual visits  *** symptoms *** palps? *** CP? *** SK  next  Past Medical History:  Diagnosis Date   Diabetes mellitus type II, non insulin dependent ([facility]) 03/27/2018    Past Surgical History:  Procedure Laterality Date   BREAST BIOPSY Left    BREAST EXCISIONAL BIOPSY Left    Pt stated she had mass removed, doctor not sure what it was   BREAST SURGERY     CESAREAN SECTION     HERNIA REPAIR      Current Outpatient Medications  Medication Sig Dispense Refill   amoxicillin (AMOXIL) 500 MG capsule Take 1 capsule (500 mg total) by mouth 3 (three) times daily. 21 capsule 0   desloratadine (CLARINEX) 5 MG tablet Take 1 tablet (5 mg total) by mouth daily. 30  tablet 11   Dulaglutide (TRULICITY) 6.26 RS/8.5IO SOPN Inject 0.75 mg into the skin once a week. 2 mL 2   HYDROcodone-acetaminophen (NORCO/VICODIN) 5-325 MG tablet Take 1 tablet by mouth every 4 (four) hours as needed. (Patient not taking: Reported on 03/30/2022) 10 tablet 0   ibuprofen (ADVIL) 800 MG tablet Take 1 tablet (800 mg total) by mouth every 8 (eight) hours as needed. 30 tablet 5   metFORMIN (GLUCOPHAGE-XR) 500 MG 24 hr tablet Take 1 tablet (500 mg total) by mouth 2 (two) times daily with a meal. 120 tablet 4   predniSONE (DELTASONE) 50 MG tablet Take 1 tablet (50 mg total) by mouth daily with breakfast. (Patient not taking: Reported on 03/30/2022) 5 tablet 0   Prenat-FeFum-DSS-FA-DHA w/o A (PNV-DHA+DOCUSATE) 27-1.25-300 MG CAPS Take 1 capsule by mouth daily before breakfast. 90 capsule 4   No current facility-administered medications for this visit.    Allergies:   Naprosyn [naproxen]   Social History:  The patient  reports that she has never smoked. She has never used smokeless tobacco. She reports current alcohol use. She reports that she does not use drugs.   Family History:  The patient's family history includes Atrial fibrillation in her mother; Diabetes in her maternal grandmother and mother; Fibroids in her mother; Heart attack (age of onset: 49) in her maternal uncle; Hypertension in her maternal grandmother and mother.  ROS:  Please see the history of present illness.    All other systems are reviewed and otherwise negative.   PHYSICAL EXAM:  VS:  LMP 02/17/2022 (Exact Date)  BMI: There is no height or weight on file to calculate BMI. Well nourished, well developed, in no acute distress HEENT: normocephalic, atraumatic Neck: no JVD, carotid bruits or masses Cardiac:  *** RRR; no significant murmurs, no rubs, or gallops Lungs:  *** CTA b/l, no wheezing, rhonchi or rales Abd: soft, nontender MS: no deformity or atrophy Ext:  no edema Skin: warm and dry, no rash Neuro:   No gross deficits appreciated Psych: euthymic mood, full affect   EKG:  Done today and reviewed by myself shows  ***  12/22/2018: c.MRI IMPRESSION: 1.  Normal LV size and systolic function, EF 27%. 2. Normal RV size and systolic function, EF 03%. No evidence for ARVC. 3. No myocardial LGE, so no definitive evidence for prior MI, infiltrative disease or myocarditis.   08/16/2018: TTE IMPRESSIONS  1. The left ventricle has normal systolic function with an ejection  fraction of 60-65%. The cavity size was mildly dilated. Left ventricular  diastolic parameters were normal.   2. The right ventricle has normal systolic function. The cavity was  normal. There is no increase in right ventricular wall thickness.   3. Left atrial size was mildly dilated.  4. The mitral valve is degenerative. Mild thickening of the mitral valve  leaflet. Mild calcification of the mitral valve leaflet. There is mild  mitral annular calcification present.   5. The tricuspid valve is normal in structure.   6. The aortic valve is tricuspid Mild sclerosis of the aortic valve.   7. The pulmonic valve was normal in structure.    07/27/2018: 30 day monitor Normal sinus rhythm. One episode of 13 beats of nonsustained ventricular tachycardia. No symptoms noted.    Recent Labs: 03/20/2022: BUN 11; Creatinine, Ser 0.90; Hemoglobin 12.6; Platelets 362; Potassium 3.8; Sodium 139  No results found for requested labs within last 365 days.   CrCl cannot be calculated (Patient's most recent lab result is older than the maximum 21 days allowed.).   Wt Readings from Last 3 Encounters:  03/30/22 279 lb (126.6 kg)  01/20/22 279 lb 3.2 oz (126.6 kg)  09/08/21 276 lb 3.2 oz (125.3 kg)     Other studies reviewed: Additional studies/records reviewed today include: summarized above  ASSESSMENT AND PLAN:  1. Palpitations 2. NSVT     *** No palpitations or syncope  3. CP *** *** Discussed cardiac health and healthy  lifestyle, labs, lipids are with her PMD  Disposition: ***    Current medicines are reviewed at length with the patient today.  The patient did not have any concerns regarding medicines.  Norma Fredrickson, PA-C 04/11/2022 10:34 AM     [facility] [address] [place] [address] [phone] (office)  [phone] (fax)

## 2022-04-12 ENCOUNTER — Ambulatory Visit: Payer: Commercial Managed Care - HMO | Attending: Physician Assistant | Admitting: Physician Assistant

## 2022-04-22 ENCOUNTER — Encounter: Payer: Self-pay | Admitting: Internal Medicine

## 2022-04-22 ENCOUNTER — Ambulatory Visit (INDEPENDENT_AMBULATORY_CARE_PROVIDER_SITE_OTHER): Payer: Commercial Managed Care - HMO | Admitting: Internal Medicine

## 2022-04-22 VITALS — BP 124/70 | HR 84 | Temp 98.2°F | Resp 16 | Ht 65.35 in | Wt 285.1 lb

## 2022-04-22 DIAGNOSIS — H01119 Allergic dermatitis of unspecified eye, unspecified eyelid: Secondary | ICD-10-CM

## 2022-04-22 DIAGNOSIS — T781XXD Other adverse food reactions, not elsewhere classified, subsequent encounter: Secondary | ICD-10-CM

## 2022-04-22 DIAGNOSIS — H1013 Acute atopic conjunctivitis, bilateral: Secondary | ICD-10-CM | POA: Diagnosis not present

## 2022-04-22 DIAGNOSIS — J3089 Other allergic rhinitis: Secondary | ICD-10-CM

## 2022-04-22 DIAGNOSIS — T781XXA Other adverse food reactions, not elsewhere classified, initial encounter: Secondary | ICD-10-CM | POA: Diagnosis not present

## 2022-04-22 MED ORDER — OLOPATADINE HCL 0.2 % OP SOLN: 1.0000 [drp] | Freq: Every day | OPHTHALMIC | 3 refills | Status: DC | PRN

## 2022-04-22 MED ORDER — CETIRIZINE HCL 10 MG PO TABS: 10.0000 mg | ORAL_TABLET | Freq: Every day | ORAL | 3 refills | Status: DC

## 2022-04-22 MED ORDER — FLUTICASONE PROPIONATE 50 MCG/ACT NA SUSP: 2.0000 | Freq: Every day | NASAL | 3 refills | Status: DC

## 2022-04-22 NOTE — Progress Notes (Signed)
NEW PATIENT  Date of Service/Encounter:  04/26/22  Consult requested by: Champ Mungo, DO   Subjective:   Sheena Soto, 1977) is a 46 y.o. female who presents to the clinic on 04/22/2022 with a chief complaint of Allergy Testing (Test everything) and Establish Care (Breakouts on her eyes, lips, tongue whelps up with citrus fruits and ketchup) .    History obtained from: chart review and patient.   Rhinitis:  Started around age 29.  Symptoms include: nasal congestion, rhinorrhea, post nasal drainage, watery eyes, and itchy eyes  Occurs year-round Potential triggers: unsure Treatments tried:  Tried OTC anti histamines which do help but just for a short period. Last use  Has tried Nasacort or Nasonex PRN  Previous allergy testing: no History of chronic sinusitis or sinus surgery: no   Concern for Food Allergy:  Foods of concern: citrus/spicy, shrimp   History of reaction:  Citrus/spicy: pineapples, kiwi, fresh tomatoes/ketchup with burning, slight itching and bumps of tongue. She still sometimes eats them in small quantities or when cooked or when they are very fresh.      Shrimp: two episodes where she felt throat itching after eating it in 2006 and went to the ER and was anxious about it. Since then, she has eaten shrimp and has not had trouble with it.  She does    Previous allergy testing no  Carries an epinephrine autoinjector: no  Dermatitis Has on and off rashes on her eyelids that are itchy and hyperpigmented.  It is sometimes worse during pollen season. No scented plug ins.   She also gets a rash around her nails when she gets fake nails or acrylics.   Using: Bar soap for wash Ponds moisturizer  Rarely make up    Past Medical History: Past Medical History:  Diagnosis Date   Diabetes mellitus type II, non insulin dependent ([facility]) 03/27/2018   Past Surgical History: Past Surgical History:  Procedure Laterality Date   BREAST BIOPSY Left     BREAST EXCISIONAL BIOPSY Left    Pt stated she had mass removed, doctor not sure what it was   BREAST SURGERY     CESAREAN SECTION     HERNIA REPAIR      Family History: Family History  Problem Relation Age of Onset   Eczema Mother    Allergic rhinitis Mother    Diabetes Mother    Hypertension Mother    Atrial fibrillation Mother    Fibroids Mother    Asthma Maternal Aunt    Heart attack Maternal Uncle 21   Diabetes Maternal Grandmother    Hypertension Maternal Grandmother     Social History:  Lives in a 8 year house Flooring in bedroom: carpet Pets: dog Tobacco use/exposure: none Job: Research scientist (medical)  Medication List:  Allergies as of 04/22/2022       Reactions   Naprosyn [naproxen] Other (See Comments)   Flared up diverticulitis        Medication List        Accurate as of April 22, 2022 11:59 PM. If you have any questions, ask your nurse or doctor.          amoxicillin 500 MG capsule Commonly known as: AMOXIL Take 1 capsule (500 mg total) by mouth 3 (three) times daily.   cetirizine 10 MG tablet Commonly known as: ZYRTEC Take 1 tablet (10 mg total) by mouth daily. Started by: Birder Robson, MD   desloratadine 5 MG tablet Commonly known  as: Clarinex Take 1 tablet (5 mg total) by mouth daily.   fluticasone 50 MCG/ACT nasal spray Commonly known as: FLONASE Place 2 sprays into both nostrils daily. Started by: Birder Robson, MD   HYDROcodone-acetaminophen 5-325 MG tablet Commonly known as: NORCO/VICODIN Take 1 tablet by mouth every 4 (four) hours as needed.   ibuprofen 800 MG tablet Commonly known as: ADVIL Take 1 tablet (800 mg total) by mouth every 8 (eight) hours as needed.   metFORMIN 500 MG 24 hr tablet Commonly known as: GLUCOPHAGE-XR Take 1 tablet (500 mg total) by mouth 2 (two) times daily with a meal.   Olopatadine HCl 0.2 % Soln Apply 1 drop to eye daily as needed. Started by: Birder Robson, MD   PNV-DHA+Docusate  27-1.25-300 MG Caps Take 1 capsule by mouth daily before breakfast.   predniSONE 50 MG tablet Commonly known as: DELTASONE Take 1 tablet (50 mg total) by mouth daily with breakfast.   Trulicity 0.75 MG/0.5ML Sopn Generic drug: Dulaglutide Inject 0.75 mg into the skin once a week.         REVIEW OF SYSTEMS: Pertinent positives and negatives discussed in HPI.   Objective:   Physical Exam: BP 124/70   Pulse 84   Temp 98.2 F (36.8 C)   Resp 16   Ht 5' 5.35" (1.66 m)   Wt 285 lb 1.6 oz (129.3 kg)   LMP 02/17/2022 (Exact Date)   SpO2 96%   BMI 46.93 kg/m  Body mass index is 46.93 kg/m. GEN: alert, well developed HEENT: clear conjunctiva, TM grey and translucent, nose with + inferior turbinate hypertrophy, pale nasal mucosa, slight clear rhinorrhea, + cobblestoning HEART: regular rate and rhythm, no murmur LUNGS: clear to auscultation bilaterally, no coughing, unlabored respiration ABDOMEN: soft, non distended  SKIN: no rashes or lesions  Reviewed:  01/20/2022: PCP visit for possible shrimp allergy and seasonal symptoms.  Was referred to an allergist and prescribed desloratadine.    Skin Testing:  Skin prick testing was placed, which includes aeroallergens/foods, histamine control, and saline control.  Verbal consent was obtained prior to placing test.  Patient tolerated procedure well.  Allergy testing results were read and interpreted by myself, documented by clinical staff. Adequate positive and negative control.  Results discussed with patient/family.      Assessment:   1. Perennial allergic rhinitis   2. Allergic conjunctivitis of both eyes   3. Eyelid dermatitis, allergic/contact   4. Adverse food reaction, subsequent encounter     Plan/Recommendations:   Allergic Rhinitis Allergic Conjunctivitis - Positive skin test 04/2022 to dust mite - Avoidance measures discussed. - Use nasal saline rinses before nose sprays such as with Neilmed Sinus Rinse.  Use  distilled water.   - Use Flonase 2 sprays each nostril daily. Aim upward and outward. - Use Zyrtec 10 mg daily.  - For eyes, use Olopatadine or Ketotifen drops as needed.  Available over the counter, if not covered by insurance.  - Consider allergy shots as long term control of your symptoms by teaching your immune system to be more tolerant of your allergy triggers  Food reactions: - reactions to pineapple, kiwi and tomatoes might be irritant reactions.   - continue avoidance if they cause significant reactions  Eyelid Dermatitis - Possibly contact dermatitis.  Can consider patch testing if persistent. - Do a daily soaking tub bath in warm water for 10-15 minutes.  - Use a gentle, unscented cleanser at the end of the bath (such as  Dove unscented bar or baby wash, or Aveeno sensitive body wash). Then rinse, pat half-way dry, and apply a gentle, unscented moisturizer cream or ointment (Cerave, Eucerin, Cetaphil, Aveeno) all over while still damp. The skin should be moisturized with a gentle, unscented moisturizer at least twice daily.  - Use only unscented liquid laundry detergent. - Apply prescribed topical steroid (hydrocortisone 2.5%) to flared areas (after the moisturizer has soaked into the skin (wait at least 30 minutes). Taper off the topical steroids as the skin improves. Do not use topical steroid for more than 7-10 days at a time.     Return in about 6 weeks (around 06/03/2022).  Alesia Morin, MD [facility]

## 2022-04-22 NOTE — Patient Instructions (Addendum)
Allergic Rhinitis Allergic Conjunctivitis - Positive skin test 04/2022 to dust mite, - Avoidance measures discussed. - Use nasal saline rinses before nose sprays such as with Neilmed Sinus Rinse.  Use distilled water.   - Use Flonase 2 sprays each nostril daily. Aim upward and outward. - Use Zyrtec 10 mg daily.  - For eyes, use Olopatadine or Ketotifen drops as needed.  Available over the counter, if not covered by insurance.  - Consider allergy shots as long term control of your symptoms by teaching your immune system to be more tolerant of your allergy triggers  Food reactions: - reactions to pineapple, kiwi and tomatoes might be irritant reactions.   - continue avoidance if they cause significant reactions   Eyelid Dermatitis - Possibly contact dermatitis.  Can consider patch testing if persistent. - Do a daily soaking tub bath in warm water for 10-15 minutes.  - Use a gentle, unscented cleanser at the end of the bath (such as Dove unscented bar or baby wash, or Aveeno sensitive body wash). Then rinse, pat half-way dry, and apply a gentle, unscented moisturizer cream or ointment (Cerave, Eucerin, Cetaphil, Aveeno) all over while still damp. The skin should be moisturized with a gentle, unscented moisturizer at least twice daily.  - Use only unscented liquid laundry detergent. - Apply prescribed topical steroid (hydrocortisone 2.5%) to flared areas (after the moisturizer has soaked into the skin (wait at least 30 minutes). Taper off the topical steroids as the skin improves. Do not use topical steroid for more than 7-10 days at a time.    ALLERGEN AVOIDANCE MEASURES  Dust Mites Use central air conditioning and heat; and change the filter monthly.  Pleated filters work better than mesh filters.  Electrostatic filters may also be used; wash the filter monthly.  Window air conditioners may be used, but do not clean the air as well as a central air conditioner.  Change or wash the filter  monthly. Keep windows closed.  Do not use attic fans.   Encase the mattress, box springs and pillows with zippered, dust proof covers. Wash the bed linens in hot water weekly.   Remove carpet, especially from the bedroom. Remove stuffed animals, throw pillows, dust ruffles, heavy drapes and other items that collect dust from the bedroom. Do not use a humidifier.   Use wood, vinyl or leather furniture instead of cloth furniture in the bedroom. Keep the indoor humidity at 30 - 40%.  Monitor with a humidity gauge.   Return in about 6 weeks (around 06/03/2022).

## 2022-04-26 ENCOUNTER — Telehealth: Payer: Self-pay | Admitting: Internal Medicine

## 2022-04-26 NOTE — Telephone Encounter (Signed)
Patient stated that she was here last week for an allergy test. She stated that later on that night there was a bump in the testing area and now it appears to be forming into a ringworm. Patient requests a call back at [phone].

## 2022-04-26 NOTE — Telephone Encounter (Signed)
Spoke with patient.  Reports one of the spots where we did the intradermal was itchy and slightly raised and now flat like.  She was worried maybe this was ringworm infection.  Informed her that our extracts are sterile so I do not believe this is a skin infection from it.  Likely a delayed reaction or irritant reaction.  Can use ice pack and some hydrocortisone PRN.  It should go down in a few days.  She thanked me for calling and voiced understanding.

## 2022-06-04 ENCOUNTER — Ambulatory Visit: Payer: Commercial Managed Care - HMO | Admitting: Internal Medicine

## 2022-06-04 DIAGNOSIS — J309 Allergic rhinitis, unspecified: Secondary | ICD-10-CM

## 2022-06-18 ENCOUNTER — Encounter: Payer: Commercial Managed Care - HMO | Admitting: Internal Medicine

## 2022-06-18 NOTE — Progress Notes (Deleted)
T2DM: Patient currently taking trulicity 1.49 mg injection weekly, metformin 500 mg BID. HbA1c last checked 01/2022 was 6.3%, today is *. She does check her blood sugar at home and they are *. Her weight is * from August 2023. BMP with normal renal function 3 months ago. Plan:  OA  ELEVATED BP W/O HTN:

## 2022-06-24 ENCOUNTER — Ambulatory Visit (INDEPENDENT_AMBULATORY_CARE_PROVIDER_SITE_OTHER): Payer: Self-pay | Admitting: Internal Medicine

## 2022-06-24 DIAGNOSIS — Z7984 Long term (current) use of oral hypoglycemic drugs: Secondary | ICD-10-CM

## 2022-06-24 DIAGNOSIS — Z0000 Encounter for general adult medical examination without abnormal findings: Secondary | ICD-10-CM

## 2022-06-24 DIAGNOSIS — Z7985 Long-term (current) use of injectable non-insulin antidiabetic drugs: Secondary | ICD-10-CM

## 2022-06-24 DIAGNOSIS — E119 Type 2 diabetes mellitus without complications: Secondary | ICD-10-CM

## 2022-06-24 DIAGNOSIS — Z1211 Encounter for screening for malignant neoplasm of colon: Secondary | ICD-10-CM

## 2022-06-24 MED ORDER — MOUNJARO 2.5 MG/0.5ML ~~LOC~~ SOAJ: 2.5000 mg | SUBCUTANEOUS | 0 refills | Status: AC

## 2022-06-24 NOTE — Progress Notes (Deleted)
T2DM: HbA1c last obtained 01/2022 was 6.3%, today is *. Current regimen is trulicity 0.16 mg injection weekly, metformin 500 mg BID. Review of her glucometer reveals:   Plan: Increase trulicity to 1.5 mg injection weekly.  ELEVATED BP: BP at goal today, *. She is not currently on anti-hypertensive thearpy. BMP last checked 03/2022 with normal renal function.  OBESE  *eye Colon flu

## 2022-06-26 DIAGNOSIS — Z0000 Encounter for general adult medical examination without abnormal findings: Secondary | ICD-10-CM | POA: Insufficient documentation

## 2022-06-26 NOTE — Assessment & Plan Note (Signed)
Referral for colonoscopy placed after discussion with the patient.

## 2022-06-26 NOTE — Assessment & Plan Note (Addendum)
Pt was scheduled to have an in-person follow up but changed it to a tele-visit. Pt states she was previously on Ozempic and was changed to trulicity which she is taking 0.75 mg dose. She states ever since making the switch she has been eating more and feels has an increased appetite. She has not checked her weight but states it appears to have increased. She denies any GI side effects from the trulicity. After prolonged discussion with the patient, we decided to start mounjaro starting at 2.5 mg dose and increasing it monthly to max dosing if pt can tolerate it. Pt stated she is planning to start 21 days of fasting where she can only consume water and juice and no solid foods. I made recommendation against that fast due to negative health effects and potential death. Pt stated she has done this in the past and her sugars are more controlled during this time. The fast start at the end of the month and lasts 21 days. I advised the patient to be seen in person for repeat A1c and to discuss this further in person. I advised pt multiple times against this due to negative health consequences. Will try to schedule in person follow up in one week for repeat A1c.

## 2022-06-26 NOTE — Progress Notes (Signed)
  [facility] Internal Medicine Residency Telephone Encounter Continuity Care Appointment  HPI:  This telephone encounter was created for [name] on 06/26/2022 for the following purpose/cc DMII follow up.   Past Medical History:  Past Medical History:  Diagnosis Date   Diabetes mellitus type II, non insulin dependent ([place]) 03/27/2018     ROS:  Negative unless stated in the HPI   Assessment / Plan / Recommendations:  Please see A&P under problem oriented charting for assessment of the patient's acute and chronic medical conditions.  As always, pt is advised that if symptoms worsen or new symptoms arise, they should go to an urgent care facility or to to ER for further evaluation.   Consent and Medical Decision Making:  Patient discussed with Dr. Dareen Piano This is a telephone encounter between [facility] and [name] on 06/26/2022 for DMII follow up. The visit was conducted with the patient located at home and [name] at [facility]. The patient's identity was confirmed using their DOB and current address. The patient has consented to being evaluated through a telephone encounter and understands the associated risks (an examination cannot be done and the patient may need to come in for an appointment) / benefits (allows the patient to remain at home, decreasing exposure to coronavirus). I personally spent 22 minutes on medical discussion.     No problem-specific Assessment & Plan notes found for this encounter.

## 2022-07-07 ENCOUNTER — Emergency Department (HOSPITAL_BASED_OUTPATIENT_CLINIC_OR_DEPARTMENT_OTHER): Payer: Self-pay | Admitting: Radiology

## 2022-07-07 ENCOUNTER — Emergency Department (HOSPITAL_BASED_OUTPATIENT_CLINIC_OR_DEPARTMENT_OTHER)
Admission: EM | Admit: 2022-07-07 | Discharge: 2022-07-07 | Disposition: A | Discharge status: Home / Self Care | Payer: Self-pay | Attending: Emergency Medicine | Admitting: Emergency Medicine

## 2022-07-07 ENCOUNTER — Other Ambulatory Visit: Payer: Self-pay

## 2022-07-07 ENCOUNTER — Encounter (HOSPITAL_BASED_OUTPATIENT_CLINIC_OR_DEPARTMENT_OTHER): Payer: Self-pay

## 2022-07-07 DIAGNOSIS — M25562 Pain in left knee: Secondary | ICD-10-CM | POA: Insufficient documentation

## 2022-07-07 DIAGNOSIS — Z7984 Long term (current) use of oral hypoglycemic drugs: Secondary | ICD-10-CM | POA: Insufficient documentation

## 2022-07-07 DIAGNOSIS — E119 Type 2 diabetes mellitus without complications: Secondary | ICD-10-CM | POA: Insufficient documentation

## 2022-07-07 MED ORDER — MELOXICAM 7.5 MG PO TABS: 7.5000 mg | ORAL_TABLET | Freq: Every day | ORAL | 0 refills | Status: DC

## 2022-07-07 NOTE — ED Triage Notes (Signed)
Patient here Pov from Home.  Endorses Ambulating Monday when she felt a "Pop" to her Left Knee. No Falls or Trauma.   NAD Noted during Triage. A&Ox4. GCS 15. BIB Wheelchair.

## 2022-07-07 NOTE — ED Provider Notes (Signed)
[place] EMERGENCY DEPARTMENT AT [facility] Provider Note   CSN: [id number] Arrival date & time: 07/07/22  1715     History  Chief Complaint  Patient presents with   Knee Pain    Sheena Soto is a 47 y.o. female with history significant for DM, osteoarthritis to both knees presents to the ED complaining of left knee pain.  She states that she was walking on Monday when she felt a "pop" in her knee.  Denies any trauma or injury to the knee and states that she did not fall.  Patient states she has had this happen in the past and it went normally improve on its own.  She states that she feels that there is fluid as well in her knee and that it was swollen.       Home Medications Prior to Admission medications   Medication Sig Start Date End Date Taking? Authorizing Provider  meloxicam (MOBIC) 7.5 MG tablet Take 1 tablet (7.5 mg total) by mouth daily. 07/07/22  Yes Dorwin Fitzhenry R, PA  amoxicillin (AMOXIL) 500 MG capsule Take 1 capsule (500 mg total) by mouth 3 (three) times daily. Patient not taking: Reported on 04/22/2022 03/20/22   Jacalyn Lefevre, MD  cetirizine (ZYRTEC) 10 MG tablet Take 1 tablet (10 mg total) by mouth daily. 04/22/22   Birder Robson, MD  desloratadine (CLARINEX) 5 MG tablet Take 1 tablet (5 mg total) by mouth daily. Patient not taking: Reported on 04/22/2022 03/30/22   Brock Bad, MD  fluticasone [facility]) 50 MCG/ACT nasal spray Place 2 sprays into both nostrils daily. 04/22/22   Birder Robson, MD  HYDROcodone-acetaminophen (NORCO/VICODIN) 5-325 MG tablet Take 1 tablet by mouth every 4 (four) hours as needed. Patient not taking: Reported on 03/30/2022 03/20/22   Jacalyn Lefevre, MD  ibuprofen (ADVIL) 800 MG tablet Take 1 tablet (800 mg total) by mouth every 8 (eight) hours as needed. 03/30/22   Brock Bad, MD  metFORMIN (GLUCOPHAGE-XR) 500 MG 24 hr tablet Take 1 tablet (500 mg total) by mouth 2 (two) times daily with a meal. 01/29/22    Champ Mungo, DO  Olopatadine HCl 0.2 % SOLN Apply 1 drop to eye daily as needed. 04/22/22   Birder Robson, MD  predniSONE (DELTASONE) 50 MG tablet Take 1 tablet (50 mg total) by mouth daily with breakfast. Patient not taking: Reported on 03/30/2022 03/20/22   Jacalyn Lefevre, MD  Prenat-FeFum-DSS-FA-DHA w/o A (PNV-DHA+DOCUSATE) 27-1.25-300 MG CAPS Take 1 capsule by mouth daily before breakfast. 03/30/22   Brock Bad, MD  tirzepatide [facility]) 2.5 MG/0.5ML Pen Inject 2.5 mg into the skin once a week for 28 days. 06/24/22 07/22/22  Gwenevere Abbot, MD      Allergies    Naprosyn [naproxen]    Review of Systems   Review of Systems  Constitutional:  Negative for fever.  Musculoskeletal:  Positive for joint swelling. Negative for arthralgias.       Left knee pain    Physical Exam Updated Vital Signs BP (!) 160/81 (BP Location: Right Arm)   Pulse (!) 102   Temp (!) 97.4 F (36.3 C) (Temporal)   Resp 18   Ht 5\' 4"  (1.626 m)   Wt 129.3 kg   SpO2 99%   BMI 48.93 kg/m  Physical Exam Vitals and nursing note reviewed.  Constitutional:      General: She is not in acute distress.    Appearance: Normal appearance. She is not ill-appearing or diaphoretic.  Pulmonary:     Effort: Pulmonary effort is normal.  Musculoskeletal:     Left knee: No swelling, deformity, effusion or bony tenderness. Normal range of motion. Tenderness present over the medial joint line. No LCL laxity, MCL laxity, ACL laxity or PCL laxity.Normal pulse.     Instability Tests: Anterior drawer test negative.     Comments: Patient has full range of motion of the left knee with tenderness along the medial joint line.  She does not have pain with movement but does when she stands up and attempts to bear weight on her leg.  Patient has a limping gait due to pain.  Neurological:     Mental Status: She is alert. Mental status is at baseline.  Psychiatric:        Mood and Affect: Mood normal.        Behavior: Behavior  normal.     ED Results / Procedures / Treatments   Labs (all labs ordered are listed, but only abnormal results are displayed) Labs Reviewed - No data to display  EKG None  Radiology DG Knee Complete 4 Views Left  Result Date: 07/07/2022 CLINICAL DATA:  Left knee pain. Felt a pop. EXAM: LEFT KNEE - COMPLETE 4+ VIEW COMPARISON:  Radiograph 07/12/2018 FINDINGS: No fracture or dislocation. Prominent medial tibiofemoral joint space narrowing. Moderate tricompartmental peripheral spurring. Mild spurring of the tibial spines. No significant knee joint effusion. No erosive change or bony destruction. No focal soft tissue abnormalities. IMPRESSION: No acute osseous abnormalities. Tricompartmental osteoarthritis without significant change from prior exam. Electronically Signed   By: Keith Rake M.D.   On: 07/07/2022 17:59    Procedures Procedures    Medications Ordered in ED Medications - No data to display  ED Course/ Medical Decision Making/ A&P                             Medical Decision Making Amount and/or Complexity of Data Reviewed Radiology: ordered.   Patient presents to the ED complaining of left side knee pain after feeling a "pop" while walking on Monday.  Denies any falls or injury to the knee.  She does have a history of osteoarthritis to both knees and has had similar events occur in the past that have resolved on their own.  She also reports that her leg and joint were swollen and she feels that there is fluid in her knee.  She denies any fever, increased warmth, or redness to the joint.  I ordered and personally interpreted the following imaging: Left knee x-ray demonstrates tricompartmental osteoarthritis without evidence of joint effusion, fracture, or dislocation.  I agree with radiologist interpretation.  Exam significant for tenderness along the medial joint line of the left knee.  No obvious swelling, increased warmth, or erythema to the knee joint.  She does  not have laxity of the ligaments surrounding the knee.  She has full range of motion without pain.  Patient does experience pain when she has to stand and attempts to ambulate.  Patient unable to bear full weight on her left knee and has a limping gait as a result.  Based on physical exam and patient's difficulty with walking, will apply Ace wrap to the left knee as well as provide crutches for additional support.  Will refer patient to orthopedics for follow-up as we cannot completely rule out ligamentous tear or other abnormality that requires advanced imaging such as MRI.  Prescription for diclofenac  was sent to patient's pharmacy.  Discussed with patient supportive care measures at home including resting, elevating, and icing her knee.  The patient has been appropriately medically screened and/or stabilized in the ED. I have low suspicion for any other emergent medical condition which would require further screening, evaluation or treatment in the ED or require inpatient management. At time of discharge the patient is hemodynamically stable and in no acute distress. I have discussed work-up results and diagnosis with patient and answered all questions. Patient is agreeable with discharge plan. We discussed strict return precautions for returning to the emergency department and they verbalized understanding.           Final Clinical Impression(s) / ED Diagnoses Final diagnoses:  Acute pain of left knee    Rx / DC Orders ED Discharge Orders          Ordered    meloxicam (MOBIC) 7.5 MG tablet  Daily        07/07/22 1941              Pat Kocher, [place] 07/07/22 1941    Audley Hose, MD 07/07/22 [phone]

## 2022-07-07 NOTE — Discharge Instructions (Addendum)
Thank you for allow me to be part of your care today.  Your x-ray imaging was reassuring for no acute abnormality, however, it did show osteoarthritis of your knee.  I recommend following up with [facility] regarding your knee pain as they may want to do more advanced imaging.  I recommend resting and elevating your leg and applying ice to your left knee for no longer than 20 minutes at a time.  I have also prescribed diclofenac which is a strong NSAID to help with your pain and swelling.  Return to the ER if you develop worsening of your symptoms or have any new concerns.

## 2022-07-07 NOTE — ED Notes (Signed)
Pt verbalized understanding of d/c instructions, meds, and followup care. Denies questions. VSS, no distress noted. Steady gait to exit with all belongings. Wheel chair to car with family.

## 2022-07-08 NOTE — Progress Notes (Signed)
Internal Medicine Clinic Attending  Case discussed with Dr. Khan  At the time of the visit.  We reviewed the resident's history and exam and pertinent patient test results.  I agree with the assessment, diagnosis, and plan of care documented in the resident's note.  

## 2022-07-16 ENCOUNTER — Ambulatory Visit: Payer: Self-pay | Attending: Physician Assistant | Admitting: Physician Assistant

## 2022-07-16 NOTE — Progress Notes (Deleted)
Cardiology Office Note Date:  07/16/2022  Patient ID:  Sheena Soto, Sheena Soto 12-11-1975, MRN [id number] PCP:  Farrel Gordon, DO  Cardiologist:  Dr. Irish Lack Electrophysiologist: Dr. Caryl Comes    Chief Complaint: *** overdue annual visit  History of Present Illness: Sheena Soto is a 47 y.o. female with history of DM, morbid obesity, OSA, does not use CPAP,  NSVT  Palpitations with H/o NS-VT - with structurally normal heart and normal SA ECG.    ? FHx of sudden death (single vehicle MVA)  - cMRI 12/2018 with normal LV and RVEF with no LGE. No evidence of ARVC.  She comes in today to be seen for Dr. Caryl Comes and A. [place], [place],  last seen by him July 2020.  At that time she was getting ready to start college and so was her daughter. No changes were made, she was pending a sleep study and encouraged weight loss  Telephone notes in Sept report recent increase in palpitations, ?reduced O2 sats, she thinks perhaps connected to a problem with her stove?  She had a GYN visit that day with stable VS.  I saw her Oct 2021 She is doing very well. She is exercising, dieting and actively losing weight.  She has a trainer that sends her workouts to complete and this has been very good for her. 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 She has a new stove and hs not had that opressive feeling since then,. She explains that she rarely cooks at home, and had not used the stove in months and when she put the oven on, eventually developed an unusual feeling of SOB, went to the kitchen and became aware of a unusual fume/odor cut off the stove and went outside and sat in her car with the a/c.  She almost immediately felt ebtter and eventually her HR and O2 levels evened out. Out sideof that she has had no cardiac awareness, no dizzy spells, near syncope or syncope. She has occassional sharp fleeting CP as well as chest  wall tenderness that she feels is 2/2 to her large breasts and chest wall aching. Since losing weight she no longer wakes though does still snore. No SOB, feels like she has good exertional capacity with her exercising and ADLs No changes, encouraged to continue weight loss and revisit sleep study/apnea.  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 She is on the go constantly without SOB She sings and has goo exertional capacity and lung capacity No near syncope or cynaope No overt palpitations, sometimes she notes by  her technology her HR up sometimes 90's-or so. She has found that she is so tired at the end of the days that she has been falling asleep on the couch and waking there as well, and now thinking about it, thinks some of her aches/pains are because of this. No changes were made, planned for annual visits   Missed her oct 2023 visit   *** MD ONLY next *** symptoms, syncope, tachy ***  labs, lipids... *** atypical CP hx *** knee trouble ER visit 1/24    Past Medical History:  Diagnosis Date   Diabetes mellitus type II, non insulin dependent ([place]) 03/27/2018    Past Surgical History:  Procedure Laterality Date   BREAST BIOPSY Left    BREAST EXCISIONAL BIOPSY Left    Pt stated she had mass removed, doctor not sure what it was   BREAST SURGERY     CESAREAN SECTION     HERNIA REPAIR      Current Outpatient Medications  Medication Sig Dispense Refill   amoxicillin (AMOXIL) 500 MG capsule Take 1 capsule (500 mg total) by mouth 3 (three) times daily. (Patient not taking: Reported on 04/22/2022) 21  capsule 0   cetirizine (ZYRTEC) 10 MG tablet Take 1 tablet (10 mg total) by mouth daily. 30 tablet 3   desloratadine (CLARINEX) 5 MG tablet Take 1 tablet (5 mg total) by mouth daily. (Patient not taking: Reported on 04/22/2022) 30 tablet 11   fluticasone (FLONASE) 50 MCG/ACT nasal spray Place 2 sprays into both nostrils daily. 16 g 3   HYDROcodone-acetaminophen (NORCO/VICODIN) 5-325 MG tablet Take 1 tablet by mouth every 4 (four) hours as needed. (Patient not taking: Reported on 03/30/2022) 10 tablet 0   ibuprofen (ADVIL) 800 MG tablet Take 1 tablet (800 mg total) by mouth every 8 (eight) hours as needed. 30 tablet 5   meloxicam (MOBIC) 7.5 MG tablet Take 1 tablet (7.5 mg total) by mouth daily. 10 tablet 0   metFORMIN (GLUCOPHAGE-XR) 500 MG 24 hr tablet Take 1 tablet (500 mg total) by mouth 2 (two) times daily with a meal. 120 tablet 4   Olopatadine HCl 0.2 % SOLN Apply 1 drop to eye daily as needed. 2.5 mL 3   predniSONE (DELTASONE) 50 MG tablet Take 1 tablet (50 mg total) by mouth daily with breakfast. (Patient not taking: Reported on 03/30/2022) 5 tablet 0   Prenat-FeFum-DSS-FA-DHA w/o A (PNV-DHA+DOCUSATE) 27-1.25-300 MG CAPS Take 1 capsule by mouth daily before breakfast. 90 capsule 4   tirzepatide (MOUNJARO) 2.5 MG/0.5ML Pen Inject 2.5 mg into the skin once a week for 28 days. 2 mL 0   No current facility-administered medications for this visit.    Allergies:   Naprosyn [naproxen]   Social History:  The patient  reports that she has never smoked. She has been exposed to tobacco smoke. She has never used smokeless tobacco. She reports current alcohol use. She reports that she does not use drugs.   Family History:  The patient's family history includes Allergic rhinitis in her mother; Asthma in her maternal aunt; Atrial fibrillation in her mother; Diabetes in her maternal grandmother and mother; Eczema in her mother; Fibroids in her mother; Heart attack (age of onset: 56) in her maternal uncle;  Hypertension in her maternal grandmother and mother.  ROS:  Please see the history of present illness.    All other systems are reviewed and otherwise negative.   PHYSICAL EXAM:  VS:  There were no vitals taken for this visit. BMI: There is no height or weight on file to calculate BMI. Well nourished, well developed, in no acute distress HEENT: normocephalic, atraumatic Neck: no JVD, carotid bruits or masses Cardiac:  *** RRR; no significant murmurs, no rubs, or gallops Lungs: ***  CTA b/l, no wheezing, rhonchi or rales Abd: soft, nontender MS: no deformity or atrophy Ext: *** no edema Skin: warm and dry, no rash Neuro:  No gross deficits appreciated Psych:  euthymic mood, full affect   EKG:  Done today and reviewed by myself shows  ***  12/22/2018: c.MRI IMPRESSION: 1.  Normal LV size and systolic function, EF [id number]. 2. Normal RV size and systolic function, EF [id number]. No evidence for ARVC. 3. No myocardial LGE, so no definitive evidence for prior MI, infiltrative disease or myocarditis.   08/16/2018: TTE IMPRESSIONS  1. The left ventricle has normal systolic function with an ejection  fraction of 60-65%. The cavity size was mildly dilated. Left ventricular  diastolic parameters were normal.   2. The right ventricle has normal systolic function. The cavity was  normal. There is no increase in right ventricular wall thickness.   3. Left atrial size was mildly dilated.   4. The mitral valve is degenerative. Mild thickening of the mitral valve  leaflet. Mild calcification of the mitral valve leaflet. There is mild  mitral annular calcification present.   5. The tricuspid valve is normal in structure.   6. The aortic valve is tricuspid Mild sclerosis of the aortic valve.   7. The pulmonic valve was normal in structure.    07/27/2018: 30 day monitor Normal sinus rhythm. One episode of 13 beats of nonsustained ventricular tachycardia. No symptoms noted.    Recent  Labs: 03/20/2022: BUN 11; Creatinine, Ser 0.90; Hemoglobin 12.6; Platelets 362; Potassium 3.8; Sodium 139  No results found for requested labs within last 365 days.   CrCl cannot be calculated (Patient's most recent lab result is older than the maximum 21 days allowed.).   Wt Readings from Last 3 Encounters:  07/07/22 285 lb 0.9 oz (129.3 kg)  04/22/22 285 lb 1.6 oz (129.3 kg)  03/30/22 279 lb (126.6 kg)     Other studies reviewed: Additional studies/records reviewed today include: summarized above  ASSESSMENT AND PLAN:  1. Palpitations 2. NSVT     *** No palpitations or syncope  3. CP *** Atypical  Discussed cardiac health and healthy lifestyle, labs, lipids are with her PMD  Disposition: ***   Current medicines are reviewed at length with the patient today.  The patient did not have any concerns regarding medicines.  Venetia Night, PA-C 07/16/2022 7:25 AM     [facility] [address] [place] [address] [phone] (office)  [phone] (fax)

## 2022-07-20 ENCOUNTER — Encounter (INDEPENDENT_AMBULATORY_CARE_PROVIDER_SITE_OTHER): Payer: Self-pay

## 2022-08-19 ENCOUNTER — Encounter: Payer: Self-pay | Admitting: Internal Medicine

## 2022-09-09 ENCOUNTER — Encounter (HOSPITAL_COMMUNITY): Payer: Self-pay

## 2022-09-09 ENCOUNTER — Ambulatory Visit (HOSPITAL_COMMUNITY)
Admission: RE | Admit: 2022-09-09 | Discharge: 2022-09-09 | Disposition: A | Discharge status: Home / Self Care | Payer: Self-pay | Source: Ambulatory Visit | Attending: Internal Medicine | Admitting: Internal Medicine

## 2022-09-09 VITALS — BP 123/81 | HR 76 | Temp 98.2°F | Resp 18

## 2022-09-09 DIAGNOSIS — N898 Other specified noninflammatory disorders of vagina: Secondary | ICD-10-CM | POA: Insufficient documentation

## 2022-09-09 DIAGNOSIS — N760 Acute vaginitis: Secondary | ICD-10-CM | POA: Insufficient documentation

## 2022-09-09 DIAGNOSIS — Z113 Encounter for screening for infections with a predominantly sexual mode of transmission: Secondary | ICD-10-CM | POA: Insufficient documentation

## 2022-09-09 LAB — POCT URINALYSIS DIPSTICK, ED / UC
Bilirubin Urine: NEGATIVE
Glucose, UA: 250 mg/dL — AB
Ketones, ur: NEGATIVE mg/dL
Leukocytes,Ua: NEGATIVE
Nitrite: NEGATIVE
Protein, ur: NEGATIVE mg/dL
Specific Gravity, Urine: 1.015 (ref 1.005–1.030)
Urobilinogen, UA: 0.2 mg/dL (ref 0.0–1.0)
pH: 5.5 (ref 5.0–8.0)

## 2022-09-09 LAB — HEPATITIS C ANTIBODY: HCV Ab: NONREACTIVE

## 2022-09-09 LAB — HIV ANTIBODY (ROUTINE TESTING W REFLEX): HIV Screen 4th Generation wRfx: NONREACTIVE

## 2022-09-09 LAB — POC URINE PREG, ED: Preg Test, Ur: NEGATIVE

## 2022-09-09 MED ORDER — METRONIDAZOLE 500 MG PO TABS: 500.0000 mg | ORAL_TABLET | Freq: Two times a day (BID) | ORAL | 0 refills | Status: DC

## 2022-09-09 NOTE — ED Provider Notes (Signed)
[place]    CSN: [id number] Arrival date & time: 09/09/22  1223      History   Chief Complaint Chief Complaint  Patient presents with   SEXUALLY TRANSMITTED DISEASE    I am irritated in my vaginal region and want to be checked. Also experiencing pain on the right side right above that area. Irritated for a week or two. Thought it was a reaction to the soap I was using. - Entered by patient    HPI Sheena Soto is a 47 y.o. female.   Patient presents today with a 2-week history of vaginal irritation.  She initially thought symptoms were related to changing her soap as she does occasionally get similar symptoms but switched to using just water for external cleaning which provided relief but not resolution of symptoms.  She does report some occasional vaginal discharge with associated vaginal odor.  She denies any abdominal pain, pelvic pain, fever, nausea, vomiting.  She does not believe that she could be pregnant but her menstrual cycle is irregular.  She denies any recent antibiotics or medication changes.  She does have a history of diabetes but reports her blood sugars are adequately controlled and were 140 this morning and 178 following a meal.  She does not take an SGLT2 inhibitor.  She has not tried any over-the-counter medication for symptom management.  Denies any urinary symptoms including frequency, urgency, hematuria.  She is interested in complete STI panel.  She is sexually active with female partners but does not always use condoms.    Past Medical History:  Diagnosis Date   Diabetes mellitus type II, non insulin dependent ([place]) 03/27/2018    Patient Active Problem List   Diagnosis Date Noted   Health care maintenance 06/26/2022   Allergic symptoms 01/21/2022   H/O lumpectomy 09/30/2021   Elevated blood pressure reading without diagnosis of hypertension 03/06/2021   Morbid obesity ([place]) 03/06/2021   Primary osteoarthritis of both knees 01/01/2019    Anxiety 07/07/2018   Obstructive sleep apnea 07/07/2018   Diabetes mellitus type II, non insulin dependent ([facility]) 03/27/2018    Past Surgical History:  Procedure Laterality Date   BREAST BIOPSY Left    BREAST EXCISIONAL BIOPSY Left    Pt stated she had mass removed, doctor not sure what it was   BREAST SURGERY     CESAREAN SECTION     HERNIA REPAIR      OB History     Gravida  3   Para  1   Term  1   Preterm      AB  2   Living  1      SAB  1   IAB  1   Ectopic      Multiple      Live Births  1            Home Medications    Prior to Admission medications   Medication Sig Start Date End Date Taking? Authorizing Provider  metFORMIN (GLUCOPHAGE-XR) 500 MG 24 hr tablet Take 1 tablet (500 mg total) by mouth 2 (two) times daily with a meal. 01/29/22  Yes Farrel Gordon, DO  metroNIDAZOLE (FLAGYL) 500 MG tablet Take 1 tablet (500 mg total) by mouth 2 (two) times daily. 09/09/22  Yes Keandre Linden, Derry Skill, PA-C  Prenat-FeFum-DSS-FA-DHA w/o A (PNV-DHA+DOCUSATE) 27-1.25-300 MG CAPS Take 1 capsule by mouth daily before breakfast. 03/30/22  Yes Shelly Bombard, MD    Family History Family History  Problem Relation Age of Onset   Eczema Mother    Allergic rhinitis Mother    Diabetes Mother    Hypertension Mother    Atrial fibrillation Mother    Fibroids Mother    Asthma Maternal Aunt    Heart attack Maternal Uncle 14   Diabetes Maternal Grandmother    Hypertension Maternal Grandmother     Social History Social History   Tobacco Use   Smoking status: Never    Passive exposure: Current   Smokeless tobacco: Never  Vaping Use   Vaping Use: Never used  Substance Use Topics   Alcohol use: Yes    Comment: occassionally   Drug use: Never     Allergies   Naprosyn [naproxen]   Review of Systems Review of Systems  Constitutional:  Positive for activity change. Negative for appetite change, fatigue and fever.  Gastrointestinal:  Negative for abdominal pain,  diarrhea, nausea and vomiting.  Genitourinary:  Positive for vaginal discharge and vaginal pain (irritation). Negative for dysuria, frequency, pelvic pain, urgency and vaginal bleeding.     Physical Exam Triage Vital Signs ED Triage Vitals  Enc Vitals Group     BP 09/09/22 1248 123/81     Pulse Rate 09/09/22 1248 76     Resp 09/09/22 1248 18     Temp 09/09/22 1248 98.2 F (36.8 C)     Temp Source 09/09/22 1248 Oral     SpO2 09/09/22 1248 96 %     Weight --      Height --      Head Circumference --      Peak Flow --      Pain Score 09/09/22 1244 4     Pain Loc --      Pain Edu? --      Excl. in [place]? --    No data found.  Updated Vital Signs BP 123/81 (BP Location: Left Arm)   Pulse 76   Temp 98.2 F (36.8 C) (Oral)   Resp 18   LMP 08/01/2022   SpO2 96%   Visual Acuity Right Eye Distance:   Left Eye Distance:   Bilateral Distance:    Right Eye Near:   Left Eye Near:    Bilateral Near:     Physical Exam Vitals reviewed. Exam conducted with a chaperone present.  Constitutional:      General: She is awake. She is not in acute distress.    Appearance: Normal appearance. She is well-developed. She is not ill-appearing.     Comments: Very pleasant female appears stated age in no acute distress sitting comfortably in exam room  HENT:     Head: Normocephalic and atraumatic.  Cardiovascular:     Rate and Rhythm: Normal rate and regular rhythm.     Heart sounds: Normal heart sounds, S1 normal and S2 normal. No murmur heard. Pulmonary:     Effort: Pulmonary effort is normal.     Breath sounds: Normal breath sounds. No wheezing, rhonchi or rales.     Comments: Clear to auscultation bilaterally Abdominal:     General: Bowel sounds are normal.     Palpations: Abdomen is soft.     Tenderness: There is no abdominal tenderness. There is no right CVA tenderness, left CVA tenderness, guarding or rebound.     Comments: Benign abdominal exam  Genitourinary:    Labia:         Right: No rash or tenderness.        Left: No  rash or tenderness.      Vagina: Normal. No vaginal discharge, erythema or tenderness.     Cervix: Normal.     Uterus: Normal.      Adnexa: Right adnexa normal and left adnexa normal.     Comments: Leisha, CMA present as chaperone during exam.  Scant white discharge noted posterior vaginal vault.  No CMT tenderness or adnexal tenderness. Psychiatric:        Behavior: Behavior is cooperative.      UC Treatments / Results  Labs (all labs ordered are listed, but only abnormal results are displayed) Labs Reviewed  POCT URINALYSIS DIPSTICK, ED / UC - Abnormal; Notable for the following components:      Result Value   Glucose, UA 250 (*)    Hgb urine dipstick TRACE (*)    All other components within normal limits  RPR  HIV ANTIBODY (ROUTINE TESTING W REFLEX)  HEPATITIS C ANTIBODY  POC URINE PREG, ED  CERVICOVAGINAL ANCILLARY ONLY    EKG   Radiology No results found.  Procedures Procedures (including critical care time)  Medications Ordered in UC Medications - No data to display  Initial Impression / Assessment and Plan / UC Course  I have reviewed the triage vital signs and the nursing notes.  Pertinent labs & imaging results that were available during my care of the patient were reviewed by me and considered in my medical decision making (see chart for details).     Patient is well-appearing, afebrile, nontoxic, nontachycardic.  Vital signs and physical exam are reassuring with no indication for emergent evaluation or imaging.  Will empirically treat for bacterial vaginosis with metronidazole.  Discussed that she is not to drink any alcohol on this medication due to associated Antabuse side effects.  STI swab was collected and is pending.  We will contact her if need to arrange additional treatment.  HIV, hepatitis, syphilis testing was obtained and is pending and we will contact her as needed to arrange treatment based on  results.  Recommended that she is hypoallergenic soaps and detergents and wear loosefitting cotton underwear.  Discussed that if she has any worsening or changing symptoms including pelvic pain, abdominal pain, fever, nausea, vomiting she needs to be seen immediately.  Strict return precautions given.  Final Clinical Impressions(s) / UC Diagnoses   Final diagnoses:  Acute vaginitis  Vaginal discharge  Routine screening for STI (sexually transmitted infection)     Discharge Instructions      We are treating you for bacterial vaginosis.  Take metronidazole twice daily for 7 days.  Do not drink any alcohol while on this medication and for few days after completing course as it will cause you to vomit.  Wear loosefitting cotton underwear and use hypoallergenic soaps and detergents.  Abstain from sex until you receive your results.  We will contact you if we need to arrange any additional treatment based on your results.  If you have any worsening or changing symptoms including abdominal pain, pelvic pain, fever, nausea, vomiting you should be seen immediately.     ED Prescriptions     Medication Sig Dispense Auth. Provider   metroNIDAZOLE (FLAGYL) 500 MG tablet Take 1 tablet (500 mg total) by mouth 2 (two) times daily. 14 tablet Jenefer Woerner, Derry Skill, PA-C      PDMP not reviewed this encounter.   Terrilee Croak, PA-C 09/09/22 1437

## 2022-09-09 NOTE — ED Triage Notes (Signed)
Pt states for 2 week she has felt "weird" since starting back dating her ex. She is now developed sharp pains on the right side, her cycle is irregular.

## 2022-09-09 NOTE — Discharge Instructions (Signed)
We are treating you for bacterial vaginosis.  Take metronidazole twice daily for 7 days.  Do not drink any alcohol while on this medication and for few days after completing course as it will cause you to vomit.  Wear loosefitting cotton underwear and use hypoallergenic soaps and detergents.  Abstain from sex until you receive your results.  We will contact you if we need to arrange any additional treatment based on your results.  If you have any worsening or changing symptoms including abdominal pain, pelvic pain, fever, nausea, vomiting you should be seen immediately.

## 2022-09-10 LAB — CERVICOVAGINAL ANCILLARY ONLY
Bacterial Vaginitis (gardnerella): POSITIVE — AB
Candida Glabrata: NEGATIVE
Candida Vaginitis: NEGATIVE
Chlamydia: NEGATIVE
Comment: NEGATIVE
Comment: NEGATIVE
Comment: NEGATIVE
Comment: NEGATIVE
Comment: NEGATIVE
Comment: NORMAL
Neisseria Gonorrhea: NEGATIVE
Trichomonas: NEGATIVE

## 2022-09-10 LAB — RPR: RPR Ser Ql: NONREACTIVE

## 2022-09-20 ENCOUNTER — Other Ambulatory Visit: Payer: Self-pay | Admitting: Internal Medicine

## 2022-09-20 ENCOUNTER — Other Ambulatory Visit (HOSPITAL_COMMUNITY): Payer: Self-pay

## 2022-09-20 DIAGNOSIS — E119 Type 2 diabetes mellitus without complications: Secondary | ICD-10-CM

## 2022-09-20 NOTE — Telephone Encounter (Signed)
Next appt scheduled 4/12 with Dr Marjie Skiff.

## 2022-09-21 ENCOUNTER — Other Ambulatory Visit (HOSPITAL_COMMUNITY): Payer: Self-pay

## 2022-09-21 MED ORDER — METFORMIN HCL ER 500 MG PO TB24
500.0000 mg | ORAL_TABLET | Freq: Two times a day (BID) | ORAL | 4 refills | Status: DC
  Filled 2022-09-21: qty 120, 60d supply, fill #0

## 2022-09-24 ENCOUNTER — Other Ambulatory Visit (HOSPITAL_COMMUNITY)
Admission: RE | Admit: 2022-09-24 | Discharge: 2022-09-24 | Disposition: A | Discharge status: Home / Self Care | Payer: Self-pay | Source: Ambulatory Visit | Attending: Student in an Organized Health Care Education/Training Program | Admitting: Student in an Organized Health Care Education/Training Program

## 2022-09-24 ENCOUNTER — Ambulatory Visit (INDEPENDENT_AMBULATORY_CARE_PROVIDER_SITE_OTHER): Payer: Self-pay

## 2022-09-24 ENCOUNTER — Other Ambulatory Visit (HOSPITAL_COMMUNITY): Payer: Self-pay

## 2022-09-24 VITALS — BP 137/60 | HR 88 | Wt 292.8 lb

## 2022-09-24 DIAGNOSIS — M25522 Pain in left elbow: Secondary | ICD-10-CM

## 2022-09-24 DIAGNOSIS — N898 Other specified noninflammatory disorders of vagina: Secondary | ICD-10-CM

## 2022-09-24 DIAGNOSIS — E119 Type 2 diabetes mellitus without complications: Secondary | ICD-10-CM

## 2022-09-24 LAB — POCT GLYCOSYLATED HEMOGLOBIN (HGB A1C): Hemoglobin A1C: 10.3 % — AB (ref 4.0–5.6)

## 2022-09-24 LAB — GLUCOSE, CAPILLARY: Glucose-Capillary: 213 mg/dL — ABNORMAL HIGH (ref 70–99)

## 2022-09-24 MED ORDER — METFORMIN HCL ER 500 MG PO TB24: 500.0000 mg | ORAL_TABLET | Freq: Two times a day (BID) | ORAL | 0 refills | Status: DC

## 2022-09-24 MED ORDER — METFORMIN HCL 500 MG PO TABS
1000.0000 mg | ORAL_TABLET | Freq: Two times a day (BID) | ORAL | 2 refills | Status: DC
  Filled 2022-09-24: qty 120, 30d supply, fill #0
  Filled 2022-11-23: qty 120, 30d supply, fill #1

## 2022-09-24 NOTE — Assessment & Plan Note (Addendum)
Patient has had worsening lateral left elbow pain for the past few months.  She said she has some weakness in her hands and forearm as well as sharp shooting pain up to her shoulder from her elbow on the lateral side as well.  No trauma to the area.  On exam, she has slight weakness in elbow flexion and extension as well as some grip weakness as well.  No clear sensory deficits right now.  No weakness proximally.  No tenderness to palpation at this point.  Not concerning for infectious etiology at this time either.  Of note, she says she has a family history of "bone cancer" which was very aggressive.  She has never had imaging of the joint.  Based on exam, main considerations would include lateral epicondylitis, nerve impingement though impingement in the elbow would not explain her proximally radiating neuropathic pain, or tendinopathy.  She says that she has tried some exercises and that this seems to help with her pain.  Will place PT referral and also obtain left elbow x-ray today.  Can follow-up with MRI if necessary, but would want to start with conservative management and go from there.

## 2022-09-24 NOTE — Assessment & Plan Note (Addendum)
Patient has had inconsistent access to her diabetes medications for the past year or so due to insurance changes.  She also over the stretch of time was not taking metformin as prescribed for about 9 months.  She was previously also taking Ozempic and loss of significant mount of weight on this medicine, but after her insurance changed, her new insurance no longer covered it.  She is currently looking for new insurance once again.  She has trialed Trulicity which she states actually caused her to have more of an appetite and actually gained weight.  Greggory Keen was discussed at last appointment, but she has not been able to get it because of lack of insurance right now.  A1c today is 10.3 up from 6.3 8 months ago.  She has some blurry vision and has an eye appointment coming up in a few days.  No neuropathic symptoms, fatigue, urinary changes, or new GI symptoms right now.  She has resumed taking metformin over the past few months and is taking two 500 mg tablets in the morning and 1 at night.  Given this, I think we can titrate up to 1000 mg twice daily for maintenance dose, new prescription sent.  Will also check lipid panel and microalbumin/creatinine ratio today in addition to BMP.  Once she has established insurance, I think she would benefit from GLP-1 in the future.  My first choice would likely be Ozempic, and then possibly Mounjaro.  Can also discuss Jardiance at that point, but would not start it now, given her ongoing genitourinary infection.  Addendum: She has significant increase in her microalbumin/creatinine ratio over the past year.  Her serum creatinine and GFR are still in the normal range.  I called her to discuss possible treatment options for this including adding on an ACE inhibitor or ARB and explained the risks of progression to diabetic kidney disease.  She wants to hold off on any other medication at this time other than the metformin and GLP-1 which we discussed at the last visit.  I did  explain that even on these, she might have progression of kidney disease and explained the risks, and she would like to still continue with out addition of ACEi/ARB.

## 2022-09-24 NOTE — Assessment & Plan Note (Signed)
Has had vaginal itching, discomfort, some suprapubic pressure for the past few weeks.  She said that she recently had a new sexual partner.  She went to the urgent care couple weeks ago and had a swab which was positive for BV as well as STI tests which were negative.  She was prescribed metronidazole which she took.  She said a few days after that point, she started to develop itching again and noticed "cottage cheese" like deposits around her vagina.  Will repeat swab today.  She likely has vaginal candidiasis with background of uncontrolled diabetes and recent antibiotics for BV.  Will send treatment as appropriate once I get swab results.

## 2022-09-24 NOTE — Progress Notes (Addendum)
CC: A1c, vaginal itching, L elbow pain  HPI:  [name] is a 47 y.o.-year-old female with past medical history as below presenting for A1c, vaginal itching, L elbow pain.  Please see encounters tab for problem-based charting.  Past Medical History:  Diagnosis Date   Diabetes mellitus type II, non insulin dependent ([facility]) 03/27/2018   Review of Systems: As in HPI.  Please see encounters tab for problem based charting.  Physical Exam:  Vitals:   09/24/22 1121  BP: 137/60  Pulse: 88  SpO2: 98%  Weight: 292 lb 12.8 oz (132.8 kg)   General:Well-appearing, pleasant, In NAD Cardiac: RRR, no murmurs rubs or gallops. Respiratory: Normal work of breathing on room air, CTAB Abdominal: Soft, nontender, nondistended MSK: full ROM L elbow with no TTP, erythema, or edema around joint. 4/5 strength elbow flexion/extension, 4/5 strength left grip. No sensory deficits  Assessment & Plan:   Diabetes mellitus type II, non insulin dependent ([facility]) Patient has had inconsistent access to her diabetes medications for the past year or so due to insurance changes.  She also over the stretch of time was not taking metformin as prescribed for about 9 months.  She was previously also taking Ozempic and loss of significant mount of weight on this medicine, but after her insurance changed, her new insurance no longer covered it.  She is currently looking for new insurance once again.  She has trialed Trulicity which she states actually caused her to have more of an appetite and actually gained weight.  Greggory Keen was discussed at last appointment, but she has not been able to get it because of lack of insurance right now.  A1c today is 10.3 up from 6.3 8 months ago.  She has some blurry vision and has an eye appointment coming up in a few days.  No neuropathic symptoms, fatigue, urinary changes, or new GI symptoms right now.  She has resumed taking metformin over the past few months and is taking two 500  mg tablets in the morning and 1 at night.  Given this, I think we can titrate up to 1000 mg twice daily for maintenance dose, new prescription sent.  Will also check lipid panel and microalbumin/creatinine ratio today in addition to BMP.  Once she has established insurance, I think she would benefit from GLP-1 in the future.  My first choice would likely be Ozempic, and then possibly Mounjaro.  Can also discuss Jardiance at that point, but would not start it now, given her ongoing genitourinary infection.  Addendum: She has significant increase in her microalbumin/creatinine ratio over the past year.  Her serum creatinine and GFR are still in the normal range.  I called her to discuss possible treatment options for this including adding on an ACE inhibitor or ARB and explained the risks of progression to diabetic kidney disease.  She wants to hold off on any other medication at this time other than the metformin and GLP-1 which we discussed at the last visit.  I did explain that even on these, she might have progression of kidney disease and explained the risks, and she would like to still continue with out addition of ACEi/ARB.  Vaginal itching Has had vaginal itching, discomfort, some suprapubic pressure for the past few weeks.  She said that she recently had a new sexual partner.  She went to the urgent care couple weeks ago and had a swab which was positive for BV as well as STI tests which were negative.  She was prescribed metronidazole which she took.  She said a few days after that point, she started to develop itching again and noticed "cottage cheese" like deposits around her vagina.  Will repeat swab today.  She likely has vaginal candidiasis with background of uncontrolled diabetes and recent antibiotics for BV.  Will send treatment as appropriate once I get swab results.  Left elbow pain Patient has had worsening lateral left elbow pain for the past few months.  She said she has some weakness in  her hands and forearm as well as sharp shooting pain up to her shoulder from her elbow on the lateral side as well.  No trauma to the area.  On exam, she has slight weakness in elbow flexion and extension as well as some grip weakness as well.  No clear sensory deficits right now.  No weakness proximally.  No tenderness to palpation at this point.  Not concerning for infectious etiology at this time either.  Of note, she says she has a family history of "bone cancer" which was very aggressive.  She has never had imaging of the joint.  Based on exam, main considerations would include lateral epicondylitis, nerve impingement though impingement in the elbow would not explain her proximally radiating neuropathic pain, or tendinopathy.  She says that she has tried some exercises and that this seems to help with her pain.  Will place PT referral and also obtain left elbow x-ray today.  Can follow-up with MRI if necessary, but would want to start with conservative management and go from there.   Patient discussed with Dr. Oswaldo Done

## 2022-09-24 NOTE — Patient Instructions (Addendum)
Sheena Soto, it was a pleasure seeing you today! You endorsed feeling well today. Below are some of the things we talked about this visit. We look forward to seeing you in the follow up appointment!  Today we discussed: Diabetes: titrate up to taking two 500mg  pills of metformin twice a day. If you have side effects, let us know  I will follow up with your swab and prescribe treatment to your pharmacy as appropriate  I will call you with your lab results  They will call you to schedule the elbow xray and physical therapy   Colonoscopy: [facility] Gastroenterologist in [place], [place] Located in: [place] [facility] [address] Address: [address], [place], [place] [address]   Phone: [phone]    I have ordered the following labs today:   Lab Orders         Microalbumin / Creatinine Urine Ratio         Glucose, capillary         POC Hbg A1C       Referrals ordered today:   Referral Orders  No referral(s) requested today     I have ordered the following medication/changed the following medications:   Stop the following medications: Medications Discontinued During This Encounter  Medication Reason   metFORMIN (GLUCOPHAGE-XR) 500 MG 24 hr tablet Duplicate     Start the following medications: No orders of the defined types were placed in this encounter.    Follow-up: 3 months   Please make sure to arrive 15 minutes prior to your next appointment. If you arrive late, you may be asked to reschedule.   We look forward to seeing you next time. Please call our clinic at [phone] if you have any questions or concerns. The best time to call is Monday-Friday from 9am-4pm, but there is someone available 24/7. If after hours or the weekend, call the main hospital number and ask for the Internal Medicine Resident On-Call. If you need medication refills, please notify your pharmacy one week in advance and  they will send [place] a request.  Thank you for letting us take part in your care. Wishing you the best!  Thank you, Lyndle Herrlich MD

## 2022-09-25 LAB — MICROALBUMIN / CREATININE URINE RATIO
Creatinine, Urine: 88.6 mg/dL
Microalb/Creat Ratio: 231 mg/g creat — ABNORMAL HIGH (ref 0–29)
Microalbumin, Urine: 204.4 ug/mL

## 2022-09-25 LAB — BMP8+ANION GAP
Anion Gap: 13 mmol/L (ref 10.0–18.0)
BUN/Creatinine Ratio: 9 (ref 9–23)
BUN: 7 mg/dL (ref 6–24)
CO2: 27 mmol/L (ref 20–29)
Calcium: 9.8 mg/dL (ref 8.7–10.2)
Chloride: 99 mmol/L (ref 96–106)
Creatinine, Ser: 0.74 mg/dL (ref 0.57–1.00)
Glucose: 193 mg/dL — ABNORMAL HIGH (ref 70–99)
Potassium: 4.2 mmol/L (ref 3.5–5.2)
Sodium: 139 mmol/L (ref 134–144)
eGFR: 101 mL/min/{1.73_m2} (ref >59)

## 2022-09-25 LAB — LIPID PANEL
Chol/HDL Ratio: 3.3 ratio (ref 0.0–4.4)
Cholesterol, Total: 146 mg/dL (ref 100–199)
HDL: 44 mg/dL (ref >39)
LDL Chol Calc (NIH): 87 mg/dL (ref 0–99)
Triglycerides: 75 mg/dL (ref 0–149)
VLDL Cholesterol Cal: 15 mg/dL (ref 5–40)

## 2022-09-27 ENCOUNTER — Telehealth: Payer: Self-pay

## 2022-09-27 NOTE — Progress Notes (Signed)
Internal Medicine Clinic Attending  Case discussed with Dr. Sridharan  At the time of the visit.  We reviewed the resident's history and exam and pertinent patient test results.  I agree with the assessment, diagnosis, and plan of care documented in the resident's note.  

## 2022-09-27 NOTE — Telephone Encounter (Signed)
Cervicovaginal ancillary only is still in process.

## 2022-09-27 NOTE — Telephone Encounter (Signed)
Pt is requesting a call back .Marland Kitchen She is wanting to know if her dr is  going to sent her any meds in for a UTI or yeast because she can see her lab results in her mychart

## 2022-09-28 LAB — CERVICOVAGINAL ANCILLARY ONLY
Bacterial Vaginitis (gardnerella): NEGATIVE
Candida Glabrata: NEGATIVE
Candida Vaginitis: POSITIVE — AB
Chlamydia: NEGATIVE
Comment: NEGATIVE
Comment: NEGATIVE
Comment: NEGATIVE
Comment: NEGATIVE
Comment: NEGATIVE
Comment: NORMAL
Neisseria Gonorrhea: NEGATIVE
Trichomonas: NEGATIVE

## 2022-09-28 MED ORDER — FLUCONAZOLE 150 MG PO TABS: ORAL_TABLET | ORAL | 0 refills | Status: DC

## 2022-09-28 NOTE — Telephone Encounter (Signed)
Pt is calling back again today requesting that someone call her back about her Test Results.  Pt states she is very uncomfortable and would like to talk to someone as soon as possible.

## 2022-10-05 ENCOUNTER — Encounter: Payer: Self-pay | Admitting: Internal Medicine

## 2022-10-06 NOTE — Progress Notes (Unsigned)
Cardiology Office Note Date:  10/06/2022  Patient ID:  Sheena Soto, Sheena Soto 03-18-1976, MRN [id number] PCP:  Champ Mungo, DO  Cardiologist:  Dr. Eldridge Dace Electrophysiologist: Dr. Graciela Husbands    Chief Complaint:  *** overdue annual visit  History of Present Illness: Sheena Soto is a 47 y.o. female with history of DM, morbid obesity, OSA, does not use CPAP,  NSVT  Palpitations with H/o NS-VT - with structurally normal heart and normal SA ECG.    ? FHx of sudden death (single vehicle MVA)  - cMRI 12/2018 with normal LV and RVEF with no LGE. No evidence of ARVC.  She comes in today to be seen for Dr. Graciela Husbands and [place]. [place], [place],  last seen by him July 2020.  At that time she was getting ready to start college and so was her daughter. No changes were made, she was pending a sleep study and encouraged weight loss  Telephone notes in Sept report recent increase in palpitations, ?reduced O2 sats, she thinks perhaps connected to a problem with her stove?  She had a GYN visit that day with stable VS.  I saw her Oct 2021 She is doing very well. She is exercising, dieting and actively losing weight.  She has a trainer that sends her workouts to complete and this has been very good for her. 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 She has a new stove and hs not had that opressive feeling since then,. She explains that she rarely cooks at home, and had not used the stove in months and when she put the oven on, eventually developed an unusual feeling of SOB, went to the kitchen and became aware of a unusual fume/odor cut off the stove and went outside and sat in her car with the a/c.  She almost immediately felt ebtter and eventually her HR and O2 levels evened out. Out sideof that she has had no cardiac awareness, no dizzy spells, near syncope or syncope. She has occassional sharp fleeting CP as well as  chest wall tenderness that she feels is 2/2 to her large breasts and chest wall aching. Since losing weight she no longer wakes though does still snore. No SOB, feels like she has good exertional capacity with her exercising and ADLs No changes, encouraged to continue weight loss and revisit sleep study/apnea.  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 She is on the go constantly without SOB She sings and has goo exertional capacity and lung capacity No near syncope or cynaope No overt palpitations, sometimes she notes by  her technology her HR up sometimes 90's-or so. She has found that she is so tired at the end of the days that she has been falling asleep on the couch and waking there as well, and now thinking about it, thinks some of her aches/pains are because of this. No changes were made, planned continued annual visits  No visits since.  A few non-cardiac ER visits, sore throat, knee pain, concerns  of STD/pain discharge  *** cardiac symptoms *** syncope *** palps..     Past Medical History:  Diagnosis Date   Diabetes mellitus type II, non insulin dependent (HCC) 03/27/2018    Past Surgical History:  Procedure Laterality Date   BREAST BIOPSY Left    BREAST EXCISIONAL BIOPSY Left    Pt stated she had mass removed, doctor not sure what it was   BREAST SURGERY     CESAREAN SECTION     HERNIA REPAIR      Current Outpatient Medications  Medication Sig Dispense Refill   fluconazole (DIFLUCAN) 150 MG tablet Take one tablet of fluconazole  and monitor your symptoms over the next 3 days. If you do  not feel like your symptoms are improving, take another tablet after 3 days and you can repeat the dose again if needed after another 3 days. 3 tablet 0   metFORMIN (GLUCOPHAGE) 500 MG tablet Take 2 tablets (1,000 mg total) by mouth 2 (two) times daily with a meal. 120 tablet 2   metroNIDAZOLE (FLAGYL) 500 MG tablet Take 1 tablet (500 mg total) by mouth 2 (two) times daily. 14 tablet 0   Prenat-FeFum-DSS-FA-DHA w/o A (PNV-DHA+DOCUSATE) 27-1.25-300 MG CAPS Take 1 capsule by mouth daily before breakfast. 90 capsule 4   No current facility-administered medications for this visit.    Allergies:   Naprosyn [naproxen]   Social History:  The patient  reports that she has never smoked. She has been exposed to tobacco smoke. She has never used smokeless tobacco. She reports current alcohol use. She reports that she does not use drugs.   Family History:  The patient's family history includes Allergic rhinitis in her mother; Asthma in her maternal aunt; Atrial fibrillation in her mother; Diabetes in her maternal grandmother and mother; Eczema in her mother; Fibroids in her mother; Heart attack (age of onset: 65) in her maternal uncle; Hypertension in her maternal grandmother and mother.  ROS:  Please see the history of present illness.    All other systems are reviewed and otherwise negative.   PHYSICAL EXAM:  VS:  LMP 08/01/2022  BMI: There is no height or weight on file to calculate BMI. Well nourished, well developed, in no acute distress HEENT: normocephalic, atraumatic Neck: no JVD, carotid bruits or masses Cardiac: *** RRR; no significant murmurs, no rubs, or gallops Lungs: *** CTA b/l, no wheezing, rhonchi or rales Abd: soft, nontender MS: no deformity or atrophy Ext: ***  no edema Skin: warm and dry, no rash Neuro:  No gross deficits appreciated Psych: euthymic mood, full affect   EKG:  Done today and reviewed by myself shows  ***  12/22/2018: c.MRI IMPRESSION: 1.  Normal LV size and  systolic function, EF 62%. 2. Normal RV size and systolic function, EF 53%. No evidence for ARVC. 3. No myocardial LGE, so no definitive evidence for prior MI, infiltrative disease or myocarditis.   08/16/2018: TTE IMPRESSIONS  1. The left ventricle has normal systolic function with an ejection  fraction of 60-65%. The cavity size was mildly dilated. Left ventricular  diastolic parameters were normal.   2. The right ventricle has normal systolic function. The cavity was  normal. There is no increase in right ventricular wall thickness.   3. Left atrial size was mildly dilated.   4. The mitral valve is degenerative. Mild thickening of the mitral valve  leaflet. Mild calcification of the mitral valve leaflet. There is mild  mitral annular calcification present.  5. The tricuspid valve is normal in structure.   6. The aortic valve is tricuspid Mild sclerosis of the aortic valve.   7. The pulmonic valve was normal in structure.    07/27/2018: 30 day monitor Normal sinus rhythm. One episode of 13 beats of nonsustained ventricular tachycardia. No symptoms noted.    Recent Labs: 03/20/2022: Hemoglobin 12.6; Platelets 362 09/24/2022: BUN 7; Creatinine, Ser 0.74; Potassium 4.2; Sodium 139  09/24/2022: Chol/HDL Ratio 3.3; Cholesterol, Total 146; HDL 44; LDL Chol Calc (NIH) 87; Triglycerides 75   Estimated Creatinine Clearance: 119.2 mL/min (by C-G formula based on SCr of 0.74 mg/dL).   Wt Readings from Last 3 Encounters:  09/24/22 292 lb 12.8 oz (132.8 kg)  07/07/22 285 lb 0.9 oz (129.3 kg)  04/22/22 285 lb 1.6 oz (129.3 kg)     Other studies reviewed: Additional studies/records reviewed today include: summarized above  ASSESSMENT AND PLAN:  1. Palpitations 2. NSVT     *** No palpitations or syncope  3. CP *** Atypical  *** Discussed cardiac health and healthy lifestyle, labs, lipids are with her PMD  Disposition: ***   Current medicines are reviewed at length with the  patient today.  The patient did not have any concerns regarding medicines.  Norma Fredrickson, PA-C 10/06/2022 7:13 AM     [facility] [address] [place] [address] [phone] (office)  [phone] (fax)

## 2022-10-07 ENCOUNTER — Ambulatory Visit: Payer: Self-pay | Attending: Physician Assistant | Admitting: Physician Assistant

## 2022-10-07 ENCOUNTER — Encounter: Payer: Self-pay | Admitting: Physician Assistant

## 2022-10-07 VITALS — BP 126/78 | Ht 65.0 in | Wt 293.0 lb

## 2022-10-07 DIAGNOSIS — R002 Palpitations: Secondary | ICD-10-CM

## 2022-10-07 DIAGNOSIS — I4729 Other ventricular tachycardia: Secondary | ICD-10-CM

## 2022-10-07 NOTE — Patient Instructions (Signed)
Medication Instructions:   Your physician recommends that you continue on your current medications as directed. Please refer to the Current Medication list given to you today.   *If you need a refill on your cardiac medications before your next appointment, please call your pharmacy*   Lab Work: NONE ORDERED  TODAY   If you have labs (blood work) drawn today and your tests are completely normal, you will receive your results only by: MyChart Message (if you have MyChart) OR A paper copy in the mail If you have any lab test that is abnormal or we need to change your treatment, we will call you to review the results.   Testing/Procedures:  NONE ORDERED  TODAY   Follow-Up: At [facility], you and your health needs are our priority.  As part of our continuing mission to provide you with exceptional heart care, we have created designated Provider Care Teams.  These Care Teams include your primary Cardiologist (physician) and Advanced Practice Providers (APPs -  Physician Assistants and Nurse Practitioners) who all work together to provide you with the care you need, when you need it.  We recommend signing up for the patient portal called "MyChart".  Sign up information is provided on this After Visit Summary.  MyChart is used to connect with patients for Virtual Visits (Telemedicine).  Patients are able to view lab/test results, encounter notes, upcoming appointments, etc.  Non-urgent messages can be sent to your provider as well.   To learn more about what you can do with MyChart, go to [email or web address].    Your next appointment:   1 year(s)  Provider:   You may see Steven Klein, MD or one of the following Advanced Practice Providers on your designated Care Team:   Renee Ursuy, PA-C Michael "Andy" Tillery, PA-C Suzann Riddle, NP    Other Instructions  

## 2022-11-05 ENCOUNTER — Telehealth: Payer: Self-pay | Admitting: *Deleted

## 2022-11-05 ENCOUNTER — Other Ambulatory Visit (HOSPITAL_COMMUNITY): Payer: Self-pay

## 2022-11-05 DIAGNOSIS — E119 Type 2 diabetes mellitus without complications: Secondary | ICD-10-CM

## 2022-11-05 MED ORDER — SEMAGLUTIDE (1 MG/DOSE) 4 MG/3ML ~~LOC~~ SOPN
1.0000 mg | PEN_INJECTOR | SUBCUTANEOUS | 2 refills | Status: AC
  Filled 2022-11-05 – 2022-11-23 (×4): qty 3, 28d supply, fill #0
  Filled 2022-12-13: qty 3, 28d supply, fill #1
  Filled 2023-08-12: qty 3, 28d supply, fill #2

## 2022-11-05 NOTE — Telephone Encounter (Signed)
Patient saw Dr. Consuelo Pandy in April for type 2 diabetes management.  Her A1c was 10.3 then.  Patient states that her A1c was much better controlled when she was on Ozempic 2 mg.  She currently only taking metformin 1 g twice daily.  She just acquired [employer] and would like to go back to [employer].  Patient was on the 2 mg dose in the past but would like to start with the 1 mg to avoid side effects.  Refill sent to [facility] outpatient pharmacy.  Patient will follow-up in 3 months for repeat A1c.

## 2022-11-05 NOTE — Telephone Encounter (Signed)
Call from pt who stated she was informed of protein in her urine by the last doctor she saw and he wanted to start her on medication but she wanted to wait. Now she's ready to start the medication.   "Urine microalbumin/cr is elevated with concurrent rise in A1c. Would recommend initiating ACEi/ARB for this, and maybe SGLT2 down the line when her candidiasis has resolved. She was hesitant to start medication for this and wanted some time to think about it." Per Dr Marjie Skiff Thanks

## 2022-11-12 ENCOUNTER — Other Ambulatory Visit: Payer: Self-pay

## 2022-11-12 DIAGNOSIS — E119 Type 2 diabetes mellitus without complications: Secondary | ICD-10-CM

## 2022-11-15 ENCOUNTER — Telehealth: Payer: Self-pay

## 2022-11-15 ENCOUNTER — Other Ambulatory Visit (HOSPITAL_COMMUNITY): Payer: Self-pay

## 2022-11-15 NOTE — Telephone Encounter (Signed)
Prior Authorization for patient (Ozempic) came through on cover my meds was submitted with last office notes and labs awaiting approval or denial 

## 2022-11-16 NOTE — Telephone Encounter (Signed)
Decision:Denied Sheena Soto (Key: [id number]) Rx #: [id number] Ozempic (1 MG/DOSE) 4MG [name] pen-injectors Form [employer], building services from [employer]

## 2022-11-18 NOTE — Telephone Encounter (Signed)
Hey Dr.Nguyen I resubmitted her office notes and labs by fax yesterday, I haven't received anything back.

## 2022-11-22 ENCOUNTER — Other Ambulatory Visit (HOSPITAL_COMMUNITY): Payer: Self-pay

## 2022-11-23 ENCOUNTER — Telehealth: Payer: Self-pay

## 2022-11-23 ENCOUNTER — Other Ambulatory Visit (HOSPITAL_COMMUNITY): Payer: Self-pay

## 2022-11-23 NOTE — Telephone Encounter (Signed)
Pt called about  her ( OZEMPIC 1 MG )   stating that  she still could not get her meds due to needing a PA and she nor the pharmacy has heard nothing back ... A new pa was submitted with last 4 office notes and labs ....      DECISION : APPROVED THROUGH  11/15/2023

## 2022-12-13 ENCOUNTER — Other Ambulatory Visit (HOSPITAL_COMMUNITY): Payer: Self-pay

## 2022-12-13 ENCOUNTER — Encounter: Payer: Self-pay | Admitting: Internal Medicine

## 2022-12-13 ENCOUNTER — Ambulatory Visit: Payer: BLUE CROSS/BLUE SHIELD | Admitting: Internal Medicine

## 2022-12-13 VITALS — BP 128/74 | HR 78 | Ht 65.0 in | Wt 286.0 lb

## 2022-12-13 DIAGNOSIS — Z1211 Encounter for screening for malignant neoplasm of colon: Secondary | ICD-10-CM

## 2022-12-13 DIAGNOSIS — E119 Type 2 diabetes mellitus without complications: Secondary | ICD-10-CM | POA: Diagnosis not present

## 2022-12-13 MED ORDER — NA SULFATE-K SULFATE-MG SULF 17.5-3.13-1.6 GM/177ML PO SOLN: 1.0000 | ORAL | 0 refills | Status: DC

## 2022-12-13 NOTE — Progress Notes (Signed)
HISTORY OF PRESENT ILLNESS:  Sheena Soto is a 47 y.o. 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  Patient has no prior GI history.  She does describe longstanding unchanged vague mild abdominal discomfort which she attributes to digestion.  She also reports many years ago having been diagnosed with diverticulitis.  It is not clear that she had imaging to support that diagnosis.  In any event, no problems since.  GI review of systems is otherwise quite unremarkable.  In particular, no change in bowel habits or rectal bleeding.  She was restarted on Ozempic about 3 weeks ago.  This previously helped with weight reduction until insurance formulary change.  She also takes metformin for diabetes.  No family history of colon cancer.  She was considering bariatric surgery and underwent EGD elsewhere August 17, 2018.  Reviewed.  Examination was normal.  Biopsies were negative for Helicobacter pylori.  Review of blood work from September 24, 2022 shows normal basic metabolic panel except for glucose of 103.  CBC in October 2023 shows normal hemoglobin 12.6.  Last hemoglobin A1c April 2024 was 10.3.  REVIEW OF SYSTEMS:  All non-GI ROS negative unless otherwise stated in the HPI except for arthritis, night sweats, excessive thirst  Past Medical History:  Diagnosis Date   Arthritis    Diabetes mellitus type II, non insulin dependent (HCC) 03/27/2018   Diverticulitis 2007    Past Surgical History:  Procedure Laterality Date   BREAST BIOPSY Left    BREAST EXCISIONAL BIOPSY Left    Pt stated she had mass removed, doctor not sure what it was   BREAST SURGERY     CESAREAN SECTION     HERNIA REPAIR      Social History Keeghan Anhorn  reports that she has never smoked. She has been exposed to tobacco smoke. She has never used smokeless tobacco. She reports current alcohol use. She reports that  she does not use drugs.  family history includes Allergic rhinitis in her mother; Asthma in her maternal aunt; Atrial fibrillation in her mother; Diabetes in her maternal grandmother and mother; Eczema in her mother; Fibroids in her mother; Heart attack (age of onset: 8) in her maternal uncle; Hypertension in her maternal grandmother and mother; Lung cancer in her maternal grandfather.  Allergies  Allergen Reactions   Naprosyn [Naproxen] Other (See Comments)    Flared up diverticulitis       PHYSICAL EXAMINATION: Vital signs: BP 128/74   Pulse 78   Ht 5\' 5"  (1.651 m)   Wt 286 lb (129.7 kg)   BMI 47.59 kg/m   Constitutional: generally well-appearing, no acute distress Psychiatric: alert and oriented x3, cooperative Eyes: extraocular movements intact, anicteric, conjunctiva pink Mouth: oral pharynx moist, no lesions Neck: supple no lymphadenopathy Cardiovascular: heart regular rate and rhythm, no murmur Lungs: clear to auscultation bilaterally Abdomen: soft, obese, nontender, nondistended, no obvious ascites, no peritoneal signs, normal bowel sounds, no organomegaly Rectal: Deferred until colonoscopy Extremities: no clubbing, cyanosis, or lower extremity edema bilaterally Skin: no lesions on visible extremities Neuro: No focal deficits.  Cranial nerves intact  ASSESSMENT:  1.  Colon cancer screening.  Average risk for colorectal neoplasia.  High risk for procedure. 2.  Morbid obesity with BMI 47.6 3.  On Ozempic 4.  Diabetes mellitus.  Poorly controlled.  Last hemoglobin A1c 10.3.  Hopefully recently started Ozempic will help.   PLAN:  1.  Colonoscopy.  Patient is high  risk due to her body habitus and comorbidities.  As well, the need to adjust medications.The nature of the procedure, as well as the risks, benefits, and alternatives were carefully and thoroughly reviewed with the patient. Ample time for discussion and questions allowed. The patient understood, was satisfied,  and agreed to proceed. 2.  Hold Ozempic at least 1 week prior to the procedure.  She understands and agrees. 3.  Hold metformin the day of the procedure 4.  Exercise and weight loss 5.  Ongoing general medical care with Dr. August Saucer A total time of 45 minutes was spent preparing to see the patient, reviewing myriad of data, obtaining comprehensive history, performing medically appropriate physical examination, counseling educating the patient regarding above listed issues, adjusting medications, ordering endoscopic procedure, and documenting clinical information in the health record

## 2022-12-13 NOTE — Patient Instructions (Signed)
_______________________________________________________  If your blood pressure at your visit was 140/90 or greater, please contact your primary care physician to follow up on this.  _______________________________________________________  If you are age 46 or older, your body mass index should be between 23-30. Your Body mass index is 47.59 kg/m. If this is out of the aforementioned range listed, please consider follow up with your Primary Care Provider.  If you are age 50 or younger, your body mass index should be between 19-25. Your Body mass index is 47.59 kg/m. If this is out of the aformentioned range listed, please consider follow up with your Primary Care Provider.   ________________________________________________________  The [facility] providers would like to encourage you to use [facility] to communicate with providers for non-urgent requests or questions.  Due to long hold times on the telephone, sending your provider a message by [facility] may be a faster and more efficient way to get a response.  Please allow 48 business hours for a response.  Please remember that this is for non-urgent requests.  _______________________________________________________   Bonita Quin have been scheduled for a colonoscopy. Please follow written instructions given to you at your visit today.   Please pick up your prep supplies at the pharmacy within the next 1-3 days.  If you use inhalers (even only as needed), please bring them with you on the day of your procedure.  DO NOT TAKE 7 DAYS PRIOR TO TEST- Trulicity (dulaglutide) Ozempic, Wegovy (semaglutide) Mounjaro (tirzepatide) Bydureon Bcise (exanatide extended release)  DO NOT TAKE 1 DAY PRIOR TO YOUR TEST Rybelsus (semaglutide) Adlyxin (lixisenatide) Victoza (liraglutide) Byetta (exanatide) ___________________________________________________________________________   We have sent the following medications to your pharmacy for you to pick up at  your convenience: Suprep  We have received you test message. Thank you for signing up for MyChart! Have a great day!

## 2022-12-14 ENCOUNTER — Other Ambulatory Visit (HOSPITAL_COMMUNITY): Payer: Self-pay

## 2022-12-20 ENCOUNTER — Encounter: Payer: BLUE CROSS/BLUE SHIELD | Admitting: Internal Medicine

## 2022-12-22 ENCOUNTER — Other Ambulatory Visit (HOSPITAL_COMMUNITY): Payer: Self-pay

## 2022-12-24 ENCOUNTER — Other Ambulatory Visit (HOSPITAL_COMMUNITY): Payer: Self-pay

## 2022-12-24 ENCOUNTER — Ambulatory Visit: Payer: BLUE CROSS/BLUE SHIELD | Admitting: Student

## 2022-12-24 ENCOUNTER — Other Ambulatory Visit: Payer: Self-pay

## 2022-12-24 ENCOUNTER — Encounter: Payer: Self-pay | Admitting: Student

## 2022-12-24 VITALS — BP 131/89 | HR 95 | Temp 98.6°F | Ht 65.0 in | Wt 287.0 lb

## 2022-12-24 DIAGNOSIS — R809 Proteinuria, unspecified: Secondary | ICD-10-CM | POA: Insufficient documentation

## 2022-12-24 DIAGNOSIS — Z6841 Body Mass Index (BMI) 40.0 and over, adult: Secondary | ICD-10-CM | POA: Diagnosis not present

## 2022-12-24 DIAGNOSIS — E1169 Type 2 diabetes mellitus with other specified complication: Secondary | ICD-10-CM | POA: Diagnosis not present

## 2022-12-24 DIAGNOSIS — E119 Type 2 diabetes mellitus without complications: Secondary | ICD-10-CM

## 2022-12-24 DIAGNOSIS — Z0000 Encounter for general adult medical examination without abnormal findings: Secondary | ICD-10-CM

## 2022-12-24 LAB — POCT GLYCOSYLATED HEMOGLOBIN (HGB A1C): Hemoglobin A1C: 8.1 % — AB (ref 4.0–5.6)

## 2022-12-24 LAB — GLUCOSE, CAPILLARY: Glucose-Capillary: 112 mg/dL — ABNORMAL HIGH (ref 70–99)

## 2022-12-24 MED ORDER — OZEMPIC (2 MG/DOSE) 8 MG/3ML ~~LOC~~ SOPN
2.0000 mg | PEN_INJECTOR | SUBCUTANEOUS | 3 refills | Status: DC
  Filled 2022-12-24 – 2023-01-25 (×2): qty 3, 28d supply, fill #0
  Filled 2023-03-08 – 2023-03-25 (×4): qty 3, 28d supply, fill #1
  Filled 2023-06-06: qty 3, 28d supply, fill #2
  Filled 2023-07-07 – 2023-08-12 (×3): qty 3, 28d supply, fill #3

## 2022-12-24 MED ORDER — OZEMPIC (2 MG/DOSE) 8 MG/3ML ~~LOC~~ SOPN: 2.0000 mg | PEN_INJECTOR | SUBCUTANEOUS | 3 refills | Status: DC

## 2022-12-24 MED ORDER — CETIRIZINE HCL 10 MG PO TABS
10.0000 mg | ORAL_TABLET | Freq: Every day | ORAL | 3 refills | Status: DC
  Filled 2022-12-24: qty 30, 30d supply, fill #0

## 2022-12-24 NOTE — Assessment & Plan Note (Signed)
Patient's A1C recently increased from 6 to above 10 due to an insurance issue that led to being off semaglutide and metformin. Now, she has changed insurance and is able to take metformin and semaglutide again.Today's A1C improved to 8.1 after being on ozempic 1 month. She denies symptoms including polyuria, polydypsia, nausea, vomiting. She does have occasional diarrhea but feels that it is manageable. Will increase ozempic to max dose for best diabetic and metabolic control.  She does have elevated ACR per two recent studies. Previously, she has preferred not to start additional medicines, as she would like to minimize her pill burden and the stress of additional meds. We talked about the benefit of a nephroprotective med and she recognizes the utility in preventing disease. She would like to retest her ACR and is willing to start medicine if needed.  This would have the added benefit of getting BP closer to goal of 120/80, which is higher today, but of note her home reading are at goal. Today she rushed into the clinic.  PLAN: - ACR today - will start lowest dose of ACE/ARB if she is still spilling protein - increase ozempic to 2mg /injection - maintain metformin 2000qd
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
## 2022-12-24 NOTE — Patient Instructions (Signed)
Ms Mcfarlain,  Thank you for coming in today. This after visit summary is an important review of tests, referrals, and medication changes that were discussed during your visit. If you have questions or concerns, call the clinic at [phone] between 9am - 4pm. Outside of clinic business hours, call the main hospital at [phone] and ask the operator for the on-call internal medicine resident.   Best, Dr. Katheran James

## 2022-12-24 NOTE — Assessment & Plan Note (Addendum)
Patient was around 273lbs on ozempic, increased to 293 off it, and now back to 287 with 1 month treatment. Additionally discussed lifestyle. She is resistant to nutritional counseling as the idea of dieting adds much stress to her life. Did attempt to talk about lifelong habits and the cumulative effect of small good choices, rather than emphasizing diet as an season of dietary extremes. Also talked about exercise - she does water and land aerobics. Continued weight loss will certainly help her quality of life and medical conditions. She is on ozempic.

## 2022-12-24 NOTE — Assessment & Plan Note (Signed)
Referral placed for mammogram. She has a colonoscopy planned for August 2024. She had an eye exam from ophalmologist in [place] in April of this year and it was negative.

## 2022-12-25 LAB — MICROALBUMIN / CREATININE URINE RATIO
Creatinine, Urine: 98.9 mg/dL
Microalb/Creat Ratio: 46 mg/g creat — ABNORMAL HIGH (ref 0–29)
Microalbumin, Urine: 45.8 ug/mL

## 2022-12-28 NOTE — Progress Notes (Signed)
Internal Medicine Clinic Attending  I was physically present during the key portions of the resident provided service and participated in the medical decision making of patient's management care. I reviewed pertinent patient test results.  The assessment, diagnosis, and plan were formulated together and I agree with the documentation in the resident's note.  Had an extensive discussion with patient about elevated uACR and she understands the importance of starting ACE-I/ARB for renal protection but currently is declining this. Her repeat uACR remains elevated and will discuss starting ACE-I/ARB again when we discuss these results   Earl Lagos, MD

## 2022-12-29 ENCOUNTER — Encounter: Payer: BLUE CROSS/BLUE SHIELD | Admitting: Internal Medicine

## 2023-01-06 ENCOUNTER — Telehealth: Payer: Self-pay | Admitting: Internal Medicine

## 2023-01-06 DIAGNOSIS — E119 Type 2 diabetes mellitus without complications: Secondary | ICD-10-CM

## 2023-01-06 MED ORDER — METFORMIN HCL ER 500 MG PO TB24: 500.0000 mg | ORAL_TABLET | Freq: Two times a day (BID) | ORAL | 3 refills | Status: DC

## 2023-01-06 MED ORDER — METFORMIN HCL ER 500 MG PO TB24: 1000.0000 mg | ORAL_TABLET | Freq: Every day | ORAL | 3 refills | Status: DC

## 2023-01-06 NOTE — Telephone Encounter (Signed)
Pt requesting a call back about the following medication.  Pt states it should be the extended release metformin  and not what is listed below.  metFORMIN metFORMIN (GLUCOPHAGE) 500 MG tablet   [id number] - [place] ([place]), [place] - [address] (Ph: [phone])

## 2023-01-06 NOTE — Telephone Encounter (Signed)
Requested removal of metformin 500 mg from medication list. She has been using her metformin ER 500 mg twice daily. Wrote for this med, sent to preferred pharmacy.

## 2023-01-17 ENCOUNTER — Telehealth: Payer: Self-pay

## 2023-01-17 NOTE — Telephone Encounter (Signed)
Prior Authorization for patient (Ozempic 2MG ) came through on cover my meds was submitted with last office notes and labs awaiting approval or denial.  [id number]

## 2023-01-18 NOTE — Telephone Encounter (Signed)
Decision:Approved Authorization On File.Authorization On File.Please note that an authorization is already on file for the requested medication and is effective through (01/17/2024). If you would like another copy of the approval letter, please call [phone] option 3, 3. Thank you.

## 2023-01-19 ENCOUNTER — Ambulatory Visit: Payer: BLUE CROSS/BLUE SHIELD

## 2023-01-25 ENCOUNTER — Other Ambulatory Visit (HOSPITAL_COMMUNITY): Payer: Self-pay

## 2023-02-01 ENCOUNTER — Other Ambulatory Visit (HOSPITAL_COMMUNITY): Payer: Self-pay

## 2023-02-02 ENCOUNTER — Telehealth: Payer: Self-pay | Admitting: Internal Medicine

## 2023-02-02 ENCOUNTER — Encounter: Payer: Self-pay | Admitting: Internal Medicine

## 2023-02-02 NOTE — Telephone Encounter (Signed)
Inbound call from patient requesting a call back to discuss out of pocket cost for 8/30 procedure. Please advise, thank you.

## 2023-02-03 ENCOUNTER — Other Ambulatory Visit (HOSPITAL_COMMUNITY): Payer: Self-pay

## 2023-02-03 ENCOUNTER — Ambulatory Visit
Admission: RE | Admit: 2023-02-03 | Discharge: 2023-02-03 | Disposition: A | Discharge status: Home / Self Care | Payer: BC Managed Care – PPO | Source: Ambulatory Visit | Attending: Internal Medicine | Admitting: Internal Medicine

## 2023-02-03 DIAGNOSIS — Z0000 Encounter for general adult medical examination without abnormal findings: Secondary | ICD-10-CM

## 2023-02-07 ENCOUNTER — Other Ambulatory Visit: Payer: Self-pay

## 2023-02-08 ENCOUNTER — Other Ambulatory Visit (HOSPITAL_COMMUNITY): Payer: Self-pay

## 2023-02-09 ENCOUNTER — Telehealth: Payer: Self-pay | Admitting: *Deleted

## 2023-02-09 NOTE — Telephone Encounter (Signed)
I tried calling patient to be sure she has a meter and supplies if she desires to check blood sugars when she has headaches. No answer.

## 2023-02-09 NOTE — Telephone Encounter (Signed)
Call from patient is currently taking Metformin 500 mg in the morning and evening.  Takes the Ozempic 2 mg weekly .  Appetite has decreased.  Now having bad headaches in the morning.  Patient is taking 500 mg ER  1 in the am and 1 in the pm.  Did not realized she was to take 1000 mg ER in the am.  Will try to do and see if this will work for her.  Currently has a headache.  Sugar is about 100.  Plans to eat something now.  When asked states that she sometimes takes her glucose in the morning before eating.  When asked when she is having the headaches does not take a glucose to check sugars that often as well.

## 2023-02-11 ENCOUNTER — Other Ambulatory Visit: Payer: Self-pay | Admitting: Medical Genetics

## 2023-02-11 ENCOUNTER — Encounter: Payer: BLUE CROSS/BLUE SHIELD | Admitting: Internal Medicine

## 2023-02-11 DIAGNOSIS — Z006 Encounter for examination for normal comparison and control in clinical research program: Secondary | ICD-10-CM

## 2023-03-08 ENCOUNTER — Other Ambulatory Visit: Payer: Self-pay

## 2023-03-08 ENCOUNTER — Other Ambulatory Visit (HOSPITAL_COMMUNITY): Payer: Self-pay

## 2023-03-10 ENCOUNTER — Other Ambulatory Visit (HOSPITAL_COMMUNITY): Payer: Self-pay

## 2023-03-15 ENCOUNTER — Telehealth: Payer: Self-pay

## 2023-03-15 NOTE — Telephone Encounter (Signed)
Mutiple attempts made to complete PV. Unable to reach patient. VM left. Pt to call the office back by 5 PM to have PV rescheduled. Pt made aware that in the event that we do not hear back from them their PV and scheduled procedure will be cancelled. No show letter pending to be sent at end of day

## 2023-03-22 ENCOUNTER — Other Ambulatory Visit (HOSPITAL_COMMUNITY): Payer: Self-pay

## 2023-03-24 ENCOUNTER — Encounter: Payer: BLUE CROSS/BLUE SHIELD | Admitting: Internal Medicine

## 2023-03-25 ENCOUNTER — Other Ambulatory Visit (HOSPITAL_COMMUNITY): Payer: Self-pay

## 2023-06-06 ENCOUNTER — Other Ambulatory Visit (HOSPITAL_COMMUNITY): Payer: Self-pay

## 2023-07-07 ENCOUNTER — Other Ambulatory Visit (HOSPITAL_COMMUNITY): Payer: Self-pay

## 2023-07-12 ENCOUNTER — Other Ambulatory Visit (HOSPITAL_COMMUNITY): Payer: Self-pay

## 2023-07-12 ENCOUNTER — Other Ambulatory Visit: Payer: Self-pay | Admitting: Internal Medicine

## 2023-07-13 ENCOUNTER — Other Ambulatory Visit (HOSPITAL_COMMUNITY): Payer: Self-pay

## 2023-07-13 ENCOUNTER — Encounter (HOSPITAL_COMMUNITY): Payer: Self-pay

## 2023-07-19 ENCOUNTER — Other Ambulatory Visit (HOSPITAL_COMMUNITY): Payer: Self-pay

## 2023-08-12 ENCOUNTER — Other Ambulatory Visit (HOSPITAL_COMMUNITY): Payer: Self-pay

## 2023-09-29 NOTE — Progress Notes (Signed)
 Cardiology Office Note Date:  09/29/2023  Patient ID:  Sheena Soto, DOB 03/19/1976, MRN [id number] PCP:  Malen Scudder, DO  Cardiologist:  Dr. Jacquelynn Matter Electrophysiologist: Dr. Rodolfo Clan    Chief Complaint:   annual visit  History of Present Illness: Sheena Soto is a 48 y.o. female with history of DM, morbid obesity, OSA, does not use CPAP,  NSVT  Palpitations with H/o NS-VT - with structurally normal heart and normal SA ECG.    ? FHx of sudden death (single vehicle MVA)  - cMRI 12/2018 with normal LV and RVEF with no LGE. No evidence of ARVC.  She comes in today to be seen for Dr. Rodolfo Clan and A. [place], [place],  last seen by him July 2020.  At that time she was getting ready to start college and so was her daughter. No changes were made, she was pending a sleep study and encouraged weight loss  Telephone notes in Sept report recent increase in palpitations, ?reduced O2 sats, she thinks perhaps connected to a problem with her stove?  She had a GYN visit that day with stable VS.  I saw her Oct 2021 She is doing very well. She is exercising, dieting and actively losing weight.  She has a trainer that sends her workouts to complete and this has been very good for her. 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 She has a new stove and hs not had that opressive feeling since then,. She explains that she rarely cooks at home, and had not used the stove in months and when she put the oven on, eventually developed an unusual feeling of SOB, went to the kitchen and became aware of a unusual fume/odor cut off the stove and went outside and sat in her car with the a/c.  She almost immediately felt ebtter and eventually her HR and O2 levels evened out. Out sideof that she has had no cardiac awareness, no dizzy spells, near syncope or syncope. She has occassional sharp fleeting CP as well as chest wall  tenderness that she feels is 2/2 to her large breasts and chest wall aching. Since losing weight she no longer wakes though does still snore. No SOB, feels like she has good exertional capacity with her exercising and ADLs No changes, encouraged to continue weight loss and revisit sleep study/apnea.  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 She is on the go constantly without SOB She sings and has goo exertional capacity and lung capacity No near syncope or cynaope No overt palpitations, sometimes she notes by  her technology her HR up sometimes 90's-or so. She has found that she is so tired at the end of the days that she has been falling asleep on the couch and waking there as well, and now thinking about it, thinks some of her aches/pains are because of this. No changes were made, planned continued annual visits  No visits since.  A few non-cardiac ER visits  I saw her 10/07/22 She  is doing very well. No CP, SOB She has infrequent awareness of her heart beat feeling fast at night, usually once in bed after having eaten late/missed her glucophage , checks her HR APP 103 or so, settles, no associated symptoms No near syncope or syncope. She is wrapping up he studies, counseling, should graduate in December She had been on Ozempic  with excellent response, weight loss of about 40lbs but her insurance stopped covering it. Started on Trulicity  with terrible sugars and gained all the weight back. She has been in contact with her PMD about revisiting Ozempic  She stared the gym this week BS are improving Planned for  annual/PRN visits  TODAY  She is doing very well Graduated w/honors and now seeking graduate studies with a goal to her PhD! 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  Every so often has an awareness of her heart beat, can hear it, seems fast but when she checks it is only in the 80's No near syncope or syncope No SOB She has some chest wall tenderness she attributes to her large breasts, no CP otherwise    Past Medical History:  Diagnosis Date   Arthritis    Diabetes mellitus type II, non insulin  dependent (HCC) 03/27/2018   Diverticulitis 2007    Past Surgical History:  Procedure Laterality Date   BREAST BIOPSY Left    BREAST EXCISIONAL BIOPSY Left    Pt stated she had mass removed, doctor not sure what it was   BREAST SURGERY     CESAREAN SECTION     HERNIA REPAIR      Current Outpatient Medications  Medication Sig Dispense Refill   cetirizine  (ZYRTEC ) 10 MG tablet Take 10 mg by mouth daily.     cetirizine  (ZYRTEC ) 10 MG tablet Take 1 tablet (10 mg total) by mouth daily. 30 tablet 3   metFORMIN  (GLUCOPHAGE -XR) 500 MG 24 hr tablet Take 2 tablets (1,000 mg total) by mouth daily with breakfast. 60 tablet 3   Na Sulfate-K Sulfate-Mg Sulf 17.5-3.13-1.6 GM/177ML SOLN Take 1 kit by mouth as directed. 354 mL 0   Semaglutide , 2 MG/DOSE, (OZEMPIC , 2 MG/DOSE,) 8 MG/3ML SOPN Inject 2 mg into the skin once a week. 3 mL 3   Semaglutide , 2 MG/DOSE, (OZEMPIC , 2 MG/DOSE,) 8 MG/3ML SOPN Inject 2 mg into the skin once a week. 3 mL 3   No current facility-administered medications for this visit.    Allergies:   Naprosyn [naproxen]   Social History:  The patient  reports that she has never smoked. She has been exposed to tobacco smoke. She has never used smokeless tobacco. She reports current alcohol use. She reports that she does  not use drugs.   Family History:  The patient's family history includes Allergic rhinitis in her mother; Asthma in her maternal aunt; Atrial fibrillation in her mother; Diabetes in her maternal grandmother and mother; Eczema in her mother; Fibroids in her mother; Heart attack (age of onset: 56) in her maternal uncle; Hypertension in her maternal grandmother and mother; Lung cancer in her maternal grandfather.  ROS:  Please see the history of present illness.    All other systems are reviewed and otherwise negative.   PHYSICAL EXAM:  VS:  There were no vitals taken for this visit. BMI: There is no height or weight on file to calculate BMI.  Well nourished, well developed, in no acute distress HEENT: normocephalic, atraumatic Neck: no JVD, carotid bruits or masses Cardiac:  RRR; no significant murmurs, no rubs, or gallops Lungs:  CTA b/l, no wheezing, rhonchi or rales Abd: soft, nontender MS: no deformity or atrophy Ext: no edema Skin: warm and dry, no rash Neuro:  No gross deficits appreciated Psych: euthymic mood, full affect   EKG:  Done today and reviewed by myself shows  SR 77bpm  12/22/2018: c.MRI IMPRESSION: 1.  Normal LV size and systolic function, EF 62%. 2. Normal RV size and systolic function, EF 53%. No evidence for ARVC. 3. No myocardial LGE, so no definitive evidence for prior MI, infiltrative disease or myocarditis.   08/16/2018: TTE IMPRESSIONS  1. The left ventricle has normal systolic function with an ejection  fraction of 60-65%. The cavity size was mildly dilated. Left ventricular  diastolic parameters were normal.   2. The right ventricle has normal systolic function. The cavity was  normal. There is no increase in right ventricular wall thickness.   3. Left atrial size was mildly dilated.   4. The mitral valve is degenerative. Mild thickening of the mitral valve  leaflet. Mild calcification of the mitral valve leaflet. There is mild  mitral annular  calcification present.   5. The tricuspid valve is normal in structure.   6. The aortic valve is tricuspid Mild sclerosis of the aortic valve.   7. The pulmonic valve was normal in structure.    07/27/2018: 30 day monitor Normal sinus rhythm. One episode of 13 beats of nonsustained ventricular tachycardia. No symptoms noted.    Recent Labs: No results found for requested labs within last 365 days.  No results found for requested labs within last 365 days.   CrCl cannot be calculated (Patient's most recent lab result is older than the maximum 21 days allowed.).   Wt Readings from Last 3 Encounters:  12/24/22 287 lb (130.2 kg)  12/13/22 286 lb (129.7 kg)  10/07/22 293 lb (132.9 kg)     Other studies reviewed: Additional studies/records reviewed today include: summarized above  ASSESSMENT AND PLAN:  1. Palpitations 2. NSVT     No near syncope or syncope      Advised she carve out some time for herself, exercise, healthy diet/weight loss She would like to continue annual check ins    Disposition: we can continue annual and PRN visits   Current medicines are reviewed at length with the patient today.  The patient did not have any concerns regarding medicines.  [place], PA-C 09/29/2023 3:20 PM     [facility] [address] [place] [address] [phone] (office)  [phone] (fax)

## 2023-10-03 ENCOUNTER — Ambulatory Visit: Payer: BC Managed Care – PPO | Attending: Physician Assistant | Admitting: Physician Assistant

## 2023-10-03 ENCOUNTER — Encounter: Payer: Self-pay | Admitting: Physician Assistant

## 2023-10-03 VITALS — BP 124/70 | HR 77 | Ht 65.0 in | Wt 286.4 lb

## 2023-10-03 DIAGNOSIS — R002 Palpitations: Secondary | ICD-10-CM | POA: Diagnosis not present

## 2023-10-03 DIAGNOSIS — I4729 Other ventricular tachycardia: Secondary | ICD-10-CM | POA: Diagnosis not present

## 2023-10-03 NOTE — Patient Instructions (Signed)
 Medication Instructions:   Your physician recommends that you continue on your current medications as directed. Please refer to the Current Medication list given to you today.   *If you need a refill on your cardiac medications before your next appointment, please call your pharmacy*   Lab Work: NONE ORDERED  TODAY    If you have labs (blood work) drawn today and your tests are completely normal, you will receive your results only by: MyChart Message (if you have MyChart) OR A paper copy in the mail If you have any lab test that is abnormal or we need to change your treatment, we will call you to review the results.  Testing/Procedures: NONE ORDERED  TODAY      Follow-Up: At [facility], you and your health needs are our priority.  As part of our continuing mission to provide you with exceptional heart care, our providers are all part of one team.  This team includes your primary Cardiologist (physician) and Advanced Practice Providers or APPs (Physician Assistants and Nurse Practitioners) who all work together to provide you with the care you need, when you need it.    Your next appointment:     1 year(s)  Provider:   Mertha Abrahams, PA-C    We recommend signing up for the patient portal called "MyChart".  Sign up information is provided on this After Visit Summary.  MyChart is used to connect with patients for Virtual Visits (Telemedicine).  Patients are able to view lab/test results, encounter notes, upcoming appointments, etc.  Non-urgent messages can be sent to your provider as well.   To learn more about what you can do with MyChart, go to [email or web address].   Other Instructions        1st Floor: - Lobby - Registration  - Pharmacy  - Lab - Cafe  2nd Floor: - PV Lab - Diagnostic Testing (echo, CT, nuclear med)  3rd Floor: - Vacant  4th Floor: - TCTS (cardiothoracic surgery) - AFib Clinic - Structural Heart Clinic - Vascular Surgery  -  Vascular Ultrasound  5th Floor: - HeartCare Cardiology (general and EP) - Clinical Pharmacy for coumadin, hypertension, lipid, weight-loss medications, and med management appointments    Valet parking services will be available as well.

## 2023-10-12 ENCOUNTER — Other Ambulatory Visit: Payer: Self-pay

## 2023-10-12 DIAGNOSIS — Z006 Encounter for examination for normal comparison and control in clinical research program: Secondary | ICD-10-CM

## 2023-10-18 LAB — GENECONNECT MOLECULAR SCREEN: Genetic Analysis Overall Interpretation: NEGATIVE

## 2023-11-24 ENCOUNTER — Other Ambulatory Visit: Payer: Self-pay | Admitting: Student

## 2023-11-24 DIAGNOSIS — E119 Type 2 diabetes mellitus without complications: Secondary | ICD-10-CM

## 2023-11-24 NOTE — Telephone Encounter (Signed)
 Patient last seen 12/24/22, I called the patient to schedule a follow up appointment. Unable to reach the patient, I lvm for her to give us  a call back.

## 2024-05-08 ENCOUNTER — Telehealth: Payer: Self-pay | Admitting: *Deleted

## 2024-05-08 NOTE — Telephone Encounter (Signed)
 Patient is being followed by Elveria Kaiser at [facility].  Dr. Damien Hutchinson has been removed as PCP.

## 2024-05-08 NOTE — Telephone Encounter (Signed)
 Copied from CRM #[id number]. Topic: Clinical - Prescription Issue >> May 07, 2024  5:13 PM Chiquita SQUIBB wrote: Reason for CRM: Redell with [facility] pharmacy just received a request for the patients metFORMIN  (GLUCOPHAGE -XR) 500 MG 24 hr tablet that was written in June. Dr. Addie is the one that wrote it but this doctor is no longer with the practice so they need to know who to put for the doctor as Dr. Sedrick medical license is inactive. Please advise [facility] at [phone].

## 2024-05-14 ENCOUNTER — Telehealth: Payer: Self-pay | Admitting: *Deleted

## 2024-05-14 NOTE — Telephone Encounter (Signed)
 Copied from CRM [id number]. Topic: Clinical - Prescription Issue >> May 09, 2024 12:43 PM DeAngela L wrote: Reason for CRM: Ole with [facility] pharmacy is calling about a prescription ran back in June when it was sent in to the pharmacy, and they are calling today for an updated license number    [facility] - [place] ([place]), [place] - [address] [place] ([place]) [place] [address] Phone: [phone] Fax: [phone]
